# Patient Record
Sex: Female | Born: 1990 | Race: Black or African American | Hispanic: No | Marital: Single | Smoking: Never smoker
Health system: Southern US, Community
[De-identification: ages and names within clinical notes are randomized; demographics above are authoritative.]

## PROBLEM LIST (undated history)

## (undated) ENCOUNTER — Inpatient Hospital Stay (HOSPITAL_COMMUNITY): Payer: Self-pay

## (undated) DIAGNOSIS — F32A Depression, unspecified: Secondary | ICD-10-CM

## (undated) DIAGNOSIS — R519 Headache, unspecified: Secondary | ICD-10-CM

## (undated) DIAGNOSIS — R1310 Dysphagia, unspecified: Secondary | ICD-10-CM

## (undated) DIAGNOSIS — R51 Headache: Secondary | ICD-10-CM

## (undated) DIAGNOSIS — R634 Abnormal weight loss: Secondary | ICD-10-CM

## (undated) DIAGNOSIS — Z22322 Carrier or suspected carrier of Methicillin resistant Staphylococcus aureus: Secondary | ICD-10-CM

## (undated) DIAGNOSIS — R5383 Other fatigue: Secondary | ICD-10-CM

## (undated) DIAGNOSIS — I10 Essential (primary) hypertension: Secondary | ICD-10-CM

## (undated) DIAGNOSIS — F419 Anxiety disorder, unspecified: Secondary | ICD-10-CM

## (undated) DIAGNOSIS — E079 Disorder of thyroid, unspecified: Secondary | ICD-10-CM

## (undated) DIAGNOSIS — H539 Unspecified visual disturbance: Secondary | ICD-10-CM

## (undated) DIAGNOSIS — E0590 Thyrotoxicosis, unspecified without thyrotoxic crisis or storm: Secondary | ICD-10-CM

## (undated) HISTORY — DX: Other fatigue: R53.83

## (undated) HISTORY — DX: Dysphagia, unspecified: R13.10

## (undated) HISTORY — DX: Headache, unspecified: R51.9

## (undated) HISTORY — DX: Disorder of thyroid, unspecified: E07.9

## (undated) HISTORY — DX: Abnormal weight loss: R63.4

## (undated) HISTORY — DX: Essential (primary) hypertension: I10

## (undated) HISTORY — DX: Unspecified visual disturbance: H53.9

## (undated) HISTORY — DX: Headache: R51

---
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
## 2010-01-23 ENCOUNTER — Ambulatory Visit (HOSPITAL_COMMUNITY)
Admission: RE | Admit: 2010-01-23 | Discharge: 2010-01-23 | Payer: Self-pay | Source: Home / Self Care | Attending: Obstetrics | Admitting: Obstetrics

## 2010-02-15 ENCOUNTER — Inpatient Hospital Stay (HOSPITAL_COMMUNITY)
Admission: AD | Admit: 2010-02-15 | Discharge: 2010-02-15 | Payer: Self-pay | Source: Home / Self Care | Attending: Obstetrics | Admitting: Obstetrics

## 2010-02-23 LAB — URINALYSIS, ROUTINE W REFLEX MICROSCOPIC
Bilirubin Urine: NEGATIVE
Hgb urine dipstick: NEGATIVE
Ketones, ur: NEGATIVE mg/dL
Nitrite: NEGATIVE
Protein, ur: NEGATIVE mg/dL
Specific Gravity, Urine: 1.02 (ref 1.005–1.030)
Urine Glucose, Fasting: NEGATIVE mg/dL
Urobilinogen, UA: 0.2 mg/dL (ref 0.0–1.0)
pH: 6 (ref 5.0–8.0)

## 2010-02-23 LAB — COMPREHENSIVE METABOLIC PANEL
ALT: 13 U/L (ref 0–35)
AST: 15 U/L (ref 0–37)
Albumin: 2.8 g/dL — ABNORMAL LOW (ref 3.5–5.2)
Alkaline Phosphatase: 94 U/L (ref 39–117)
BUN: 3 mg/dL — ABNORMAL LOW (ref 6–23)
CO2: 25 mEq/L (ref 19–32)
Calcium: 8.8 mg/dL (ref 8.4–10.5)
Chloride: 108 mEq/L (ref 96–112)
Creatinine, Ser: 0.33 mg/dL — ABNORMAL LOW (ref 0.4–1.2)
GFR calc Af Amer: >60 mL/min (ref >60)
GFR calc non Af Amer: >60 mL/min (ref >60)
Glucose, Bld: 98 mg/dL (ref 70–99)
Potassium: 3.9 mEq/L (ref 3.5–5.1)
Sodium: 137 mEq/L (ref 135–145)
Total Bilirubin: 0.3 mg/dL (ref 0.3–1.2)
Total Protein: 5.8 g/dL — ABNORMAL LOW (ref 6.0–8.3)

## 2010-02-23 LAB — CBC
HCT: 32.3 % — ABNORMAL LOW (ref 36.0–46.0)
Hemoglobin: 11.5 g/dL — ABNORMAL LOW (ref 12.0–15.0)
MCH: 25.6 pg — ABNORMAL LOW (ref 26.0–34.0)
MCHC: 35.6 g/dL (ref 30.0–36.0)
MCV: 71.9 fL — ABNORMAL LOW (ref 78.0–100.0)
Platelets: 262 10*3/uL (ref 150–400)
RBC: 4.49 MIL/uL (ref 3.87–5.11)
RDW: 13.4 % (ref 11.5–15.5)
WBC: 9.2 10*3/uL (ref 4.0–10.5)

## 2010-04-23 LAB — URINALYSIS, ROUTINE W REFLEX MICROSCOPIC
Bilirubin Urine: NEGATIVE
Glucose, UA: NEGATIVE mg/dL
Ketones, ur: NEGATIVE mg/dL
Leukocytes, UA: NEGATIVE
Nitrite: NEGATIVE
Protein, ur: NEGATIVE mg/dL
Specific Gravity, Urine: 1.025 (ref 1.005–1.030)
Urobilinogen, UA: 0.2 mg/dL (ref 0.0–1.0)
pH: 5.5 (ref 5.0–8.0)

## 2010-04-23 LAB — WET PREP, GENITAL
Clue Cells Wet Prep HPF POC: NONE SEEN
Trich, Wet Prep: NONE SEEN
Yeast Wet Prep HPF POC: NONE SEEN

## 2010-04-23 LAB — URINE MICROSCOPIC-ADD ON

## 2010-04-23 LAB — GC/CHLAMYDIA PROBE AMP, GENITAL
Chlamydia, DNA Probe: NEGATIVE
GC Probe Amp, Genital: NEGATIVE

## 2010-04-24 LAB — POCT PREGNANCY, URINE: Preg Test, Ur: POSITIVE

## 2010-05-14 LAB — DIFFERENTIAL
Basophils Absolute: 0 10*3/uL (ref 0.0–0.1)
Basophils Absolute: 0 10*3/uL (ref 0.0–0.1)
Basophils Relative: 0 % (ref 0–1)
Basophils Relative: 0 % (ref 0–1)
Eosinophils Absolute: 0.2 10*3/uL (ref 0.0–0.7)
Eosinophils Absolute: 0.4 10*3/uL (ref 0.0–0.7)
Eosinophils Relative: 1 % (ref 0–5)
Eosinophils Relative: 2 % (ref 0–5)
Lymphocytes Relative: 5 % — ABNORMAL LOW (ref 12–46)
Lymphocytes Relative: 6 % — ABNORMAL LOW (ref 12–46)
Lymphs Abs: 1 10*3/uL (ref 0.7–4.0)
Lymphs Abs: 1.2 10*3/uL (ref 0.7–4.0)
Monocytes Absolute: 0.2 10*3/uL (ref 0.1–1.0)
Monocytes Absolute: 0.6 10*3/uL (ref 0.1–1.0)
Monocytes Relative: 1 % — ABNORMAL LOW (ref 3–12)
Monocytes Relative: 3 % (ref 3–12)
Neutro Abs: 18.2 10*3/uL — ABNORMAL HIGH (ref 1.7–7.7)
Neutro Abs: 19.3 10*3/uL — ABNORMAL HIGH (ref 1.7–7.7)
Neutrophils Relative %: 90 % — ABNORMAL HIGH (ref 43–77)
Neutrophils Relative %: 93 % — ABNORMAL HIGH (ref 43–77)

## 2010-05-14 LAB — CBC
HCT: 32 % — ABNORMAL LOW (ref 36.0–46.0)
HCT: 32.4 % — ABNORMAL LOW (ref 36.0–46.0)
HCT: 33.1 % — ABNORMAL LOW (ref 36.0–46.0)
HCT: 33.2 % — ABNORMAL LOW (ref 36.0–46.0)
HCT: 37.4 % (ref 36.0–46.0)
Hemoglobin: 10.5 g/dL — ABNORMAL LOW (ref 12.0–15.0)
Hemoglobin: 10.6 g/dL — ABNORMAL LOW (ref 12.0–15.0)
Hemoglobin: 10.8 g/dL — ABNORMAL LOW (ref 12.0–15.0)
Hemoglobin: 11.1 g/dL — ABNORMAL LOW (ref 12.0–15.0)
Hemoglobin: 12.3 g/dL (ref 12.0–15.0)
MCHC: 32.5 g/dL (ref 30.0–36.0)
MCHC: 32.6 g/dL (ref 30.0–36.0)
MCHC: 33.2 g/dL (ref 30.0–36.0)
MCHC: 33.4 g/dL (ref 30.0–36.0)
MCHC: 32.8 g/dL (ref 30.0–36.0)
MCV: 86.8 fL (ref 78.0–100.0)
MCV: 87.8 fL (ref 78.0–100.0)
MCV: 88.1 fL (ref 78.0–100.0)
MCV: 89.1 fL (ref 78.0–100.0)
MCV: 88.7 fL (ref 78.0–100.0)
Platelets: 250 10*3/uL (ref 150–400)
Platelets: 451 10*3/uL — ABNORMAL HIGH (ref 150–400)
Platelets: 546 10*3/uL — ABNORMAL HIGH (ref 150–400)
Platelets: 589 10*3/uL — ABNORMAL HIGH (ref 150–400)
Platelets: 304 10*3/uL (ref 150–400)
RBC: 3.69 MIL/uL — ABNORMAL LOW (ref 3.87–5.11)
RBC: 3.69 MIL/uL — ABNORMAL LOW (ref 3.87–5.11)
RBC: 3.71 MIL/uL — ABNORMAL LOW (ref 3.87–5.11)
RBC: 3.77 MIL/uL — ABNORMAL LOW (ref 3.87–5.11)
RBC: 4.22 MIL/uL (ref 3.87–5.11)
RDW: 13.4 % (ref 11.5–15.5)
RDW: 13.4 % (ref 11.5–15.5)
RDW: 13.5 % (ref 11.5–15.5)
RDW: 13.6 % (ref 11.5–15.5)
RDW: 13.5 % (ref 11.5–15.5)
WBC: 19.6 10*3/uL — ABNORMAL HIGH (ref 4.0–10.5)
WBC: 21.5 10*3/uL — ABNORMAL HIGH (ref 4.0–10.5)
WBC: 22.7 10*3/uL — ABNORMAL HIGH (ref 4.0–10.5)
WBC: 26.4 10*3/uL — ABNORMAL HIGH (ref 4.0–10.5)
WBC: 13.1 10*3/uL — ABNORMAL HIGH (ref 4.0–10.5)

## 2010-05-14 LAB — CREATININE, SERUM
Creatinine, Ser: 0.94 mg/dL (ref 0.4–1.2)
GFR calc Af Amer: >60 mL/min (ref >60)
GFR calc non Af Amer: >60 mL/min (ref >60)

## 2010-05-14 LAB — GENTAMICIN LEVEL, TROUGH: Gentamicin Trough: 0.5 ug/mL (ref 0.5–2.0)

## 2010-05-14 LAB — RPR: RPR Ser Ql: NONREACTIVE

## 2010-05-14 LAB — GENTAMICIN LEVEL, PEAK: Gentamicin Pk: 5.1 ug/mL (ref 5.0–10.0)

## 2010-05-21 LAB — WET PREP, GENITAL
Clue Cells Wet Prep HPF POC: NONE SEEN
Trich, Wet Prep: NONE SEEN

## 2010-05-21 LAB — GC/CHLAMYDIA PROBE AMP, GENITAL
Chlamydia, DNA Probe: NEGATIVE
GC Probe Amp, Genital: NEGATIVE

## 2010-05-21 LAB — ABO/RH: ABO/RH(D): O POS

## 2010-05-27 ENCOUNTER — Other Ambulatory Visit (HOSPITAL_COMMUNITY): Payer: Self-pay | Admitting: Obstetrics

## 2010-05-27 DIAGNOSIS — IMO0002 Reserved for concepts with insufficient information to code with codable children: Secondary | ICD-10-CM

## 2010-05-29 ENCOUNTER — Ambulatory Visit (HOSPITAL_COMMUNITY)
Admission: RE | Admit: 2010-05-29 | Discharge: 2010-05-29 | Disposition: A | Discharge status: Home / Self Care | Payer: Managed Care, Other (non HMO) | Source: Ambulatory Visit | Attending: Obstetrics | Admitting: Obstetrics

## 2010-05-29 DIAGNOSIS — IMO0002 Reserved for concepts with insufficient information to code with codable children: Secondary | ICD-10-CM

## 2010-05-29 DIAGNOSIS — O34219 Maternal care for unspecified type scar from previous cesarean delivery: Secondary | ICD-10-CM | POA: Insufficient documentation

## 2010-05-29 DIAGNOSIS — Z3689 Encounter for other specified antenatal screening: Secondary | ICD-10-CM | POA: Insufficient documentation

## 2010-06-03 ENCOUNTER — Inpatient Hospital Stay (HOSPITAL_COMMUNITY): Payer: Managed Care, Other (non HMO)

## 2010-06-03 ENCOUNTER — Inpatient Hospital Stay (HOSPITAL_COMMUNITY)
Admission: AD | Admit: 2010-06-03 | Discharge: 2010-06-03 | Disposition: A | Discharge status: Home / Self Care | Payer: Managed Care, Other (non HMO) | Source: Ambulatory Visit | Attending: Obstetrics | Admitting: Obstetrics

## 2010-06-03 ENCOUNTER — Encounter (HOSPITAL_COMMUNITY): Payer: Self-pay | Admitting: Radiology

## 2010-06-03 DIAGNOSIS — O368190 Decreased fetal movements, unspecified trimester, not applicable or unspecified: Secondary | ICD-10-CM | POA: Insufficient documentation

## 2010-06-05 ENCOUNTER — Inpatient Hospital Stay (HOSPITAL_COMMUNITY): Payer: Managed Care, Other (non HMO)

## 2010-06-05 ENCOUNTER — Inpatient Hospital Stay (HOSPITAL_COMMUNITY)
Admission: AD | Admit: 2010-06-05 | Discharge: 2010-06-05 | Disposition: A | Discharge status: Home / Self Care | Payer: Managed Care, Other (non HMO) | Source: Ambulatory Visit | Attending: Obstetrics | Admitting: Obstetrics

## 2010-06-05 DIAGNOSIS — Z0371 Encounter for suspected problem with amniotic cavity and membrane ruled out: Secondary | ICD-10-CM | POA: Insufficient documentation

## 2010-06-08 ENCOUNTER — Inpatient Hospital Stay (HOSPITAL_COMMUNITY)
Admission: AD | Admit: 2010-06-08 | Discharge: 2010-06-11 | DRG: 775 | Disposition: A | Discharge status: Home / Self Care | Payer: Managed Care, Other (non HMO) | Source: Ambulatory Visit | Attending: Obstetrics | Admitting: Obstetrics

## 2010-06-08 ENCOUNTER — Inpatient Hospital Stay (HOSPITAL_COMMUNITY): Payer: Managed Care, Other (non HMO)

## 2010-06-08 DIAGNOSIS — O4100X0 Oligohydramnios, unspecified trimester, not applicable or unspecified: Principal | ICD-10-CM | POA: Diagnosis present

## 2010-06-08 DIAGNOSIS — O34219 Maternal care for unspecified type scar from previous cesarean delivery: Secondary | ICD-10-CM | POA: Diagnosis present

## 2010-06-08 LAB — CBC
MCH: 25.2 pg — ABNORMAL LOW (ref 26.0–34.0)
MCHC: 33.8 g/dL (ref 30.0–36.0)
Platelets: 270 10*3/uL (ref 150–400)
RBC: 4.85 MIL/uL (ref 3.87–5.11)

## 2010-06-08 LAB — RPR: RPR Ser Ql: NONREACTIVE

## 2010-06-10 LAB — CBC
MCH: 24.5 pg — ABNORMAL LOW (ref 26.0–34.0)
MCHC: 32.6 g/dL (ref 30.0–36.0)
Platelets: 215 10*3/uL (ref 150–400)

## 2010-09-10 ENCOUNTER — Ambulatory Visit (INDEPENDENT_AMBULATORY_CARE_PROVIDER_SITE_OTHER): Payer: Managed Care, Other (non HMO) | Admitting: General Surgery

## 2010-09-22 ENCOUNTER — Ambulatory Visit (INDEPENDENT_AMBULATORY_CARE_PROVIDER_SITE_OTHER): Payer: Managed Care, Other (non HMO) | Admitting: Surgery

## 2010-10-05 ENCOUNTER — Encounter (INDEPENDENT_AMBULATORY_CARE_PROVIDER_SITE_OTHER): Payer: Self-pay | Admitting: General Surgery

## 2010-10-07 ENCOUNTER — Ambulatory Visit (INDEPENDENT_AMBULATORY_CARE_PROVIDER_SITE_OTHER): Payer: Managed Care, Other (non HMO) | Admitting: General Surgery

## 2010-11-02 ENCOUNTER — Encounter (INDEPENDENT_AMBULATORY_CARE_PROVIDER_SITE_OTHER): Payer: Self-pay | Admitting: Surgery

## 2010-11-03 ENCOUNTER — Ambulatory Visit (INDEPENDENT_AMBULATORY_CARE_PROVIDER_SITE_OTHER): Payer: Medicaid Other | Admitting: Surgery

## 2010-11-03 ENCOUNTER — Encounter (INDEPENDENT_AMBULATORY_CARE_PROVIDER_SITE_OTHER): Payer: Self-pay | Admitting: Surgery

## 2010-11-03 VITALS — BP 122/78 | HR 70 | Temp 97.2°F | Resp 20 | Ht 64.0 in | Wt 147.0 lb

## 2010-11-03 DIAGNOSIS — E0500 Thyrotoxicosis with diffuse goiter without thyrotoxic crisis or storm: Secondary | ICD-10-CM | POA: Insufficient documentation

## 2010-11-03 DIAGNOSIS — E049 Nontoxic goiter, unspecified: Secondary | ICD-10-CM | POA: Insufficient documentation

## 2010-11-03 NOTE — Progress Notes (Signed)
Chief Complaint  Patient presents with  . Other    new pt-eval of thyroid     HPI Tanya Chapman is a 20 y.o. female.   HPIThis is a pleasant female referred by Dr. Margaretmary Bayley for evaluation of Graves' disease and thyroid goiter. The patient has been suffering from this for several years. She is interested in thyroidectomy as she has been noncompliant with her medications. She has typical symptoms of hyper thyroidism and has been followed closely by Dr. Chestine Spore for this.  Past Medical History  Diagnosis Date  . Thyroid disease     hyperthyroid   . Weight loss, unintentional   . Trouble swallowing   . Visual disturbance     eyes bulging, blurry, and hurting   . Generalized headaches     migraines and generalized   . Fatigue     Past Surgical History  Procedure Date  . Hernia repair 1996  . Cesarean section 11/22/08    History reviewed. No pertinent family history.  Social History History  Substance Use Topics  . Smoking status: Never Smoker   . Smokeless tobacco: Not on file  . Alcohol Use: No    No Known Allergies  Current Outpatient Prescriptions  Medication Sig Dispense Refill  . methimazole (TAPAZOLE) 5 MG tablet Take 5 mg by mouth 2 (two) times daily. Confirm dosage with patient at next appt. Unsure of it at this time.         Review of Systems Review of Systems An extensive review of system was evaluated with the patient. It is positive for weight loss, trouble swallowing, visual disturbances, headache, and weakness. It is negative from a cardiopulmonary standpoint. Blood pressure 122/78, pulse 70, temperature 97.2 F (36.2 C), resp. rate 20, height 5\' 4"  (1.626 m), weight 147 lb (66.679 kg).  Physical Exam Physical Exam  Constitutional: She is oriented to person, place, and time. She appears well-developed and well-nourished. No distress.  HENT:  Head: Normocephalic and atraumatic.  Right Ear: External ear normal.  Left Ear: External ear normal.    Nose: Nose normal.  Mouth/Throat: Oropharynx is clear and moist.  Eyes: Conjunctivae are normal. Pupils are equal, round, and reactive to light. No scleral icterus.  Neck: Normal range of motion. Neck supple. No tracheal deviation present. Thyromegaly present.  Cardiovascular: Normal rate, regular rhythm, normal heart sounds and intact distal pulses.   No murmur heard. Pulmonary/Chest: Effort normal and breath sounds normal. No stridor. No respiratory distress. She has no wheezes. She has no rales.  Abdominal: Soft. Bowel sounds are normal. She exhibits no distension. There is no tenderness.  Musculoskeletal: Normal range of motion. She exhibits no edema and no tenderness.  Lymphadenopathy:    She has no cervical adenopathy.  Neurological: She is alert and oriented to person, place, and time. She displays normal reflexes. No cranial nerve deficit.  Skin: Skin is warm and dry. No rash noted. No erythema.  Psychiatric: Her behavior is normal. Judgment normal.    Data Reviewed I have the notes from Dr. Ophelia Charter office which I have reviewed as well as her recent laboratory data.  Assessment    Patient with Graves' disease and multinodular goiter which is symptomatic.    Plan    Because of her noncompliance and her symptoms, she is eager to proceed with an his recommended she undergo total thyroidectomy. I discussed this decision with her in detail. Dr. Chestine Spore as discussed medical management with her. Again, she wished to proceed  with thyroidectomy. I discussed the risks of surgery which includes but is not limited to bleeding, infection, injury to the recurrent laryngeal nerve, hoarseness, damage to the parathyroid glands, hypocalcemia, et Karie Soda. She understands and wishes to proceed. Surgery will be scheduled.       Tanya Chapman A 11/03/2010, 10:56 AM

## 2010-11-19 ENCOUNTER — Encounter (HOSPITAL_COMMUNITY)
Admission: RE | Admit: 2010-11-19 | Discharge: 2010-11-19 | Disposition: A | Discharge status: Home / Self Care | Payer: Managed Care, Other (non HMO) | Source: Ambulatory Visit | Attending: Surgery | Admitting: Surgery

## 2010-11-19 LAB — CBC
Platelets: 320 10*3/uL (ref 150–400)
RDW: 13 % (ref 11.5–15.5)
WBC: 5.1 10*3/uL (ref 4.0–10.5)

## 2010-11-19 LAB — BASIC METABOLIC PANEL
GFR calc Af Amer: >90 mL/min (ref >90)
GFR calc non Af Amer: >90 mL/min (ref >90)
Potassium: 3.9 mEq/L (ref 3.5–5.1)
Sodium: 142 mEq/L (ref 135–145)

## 2010-11-19 LAB — SURGICAL PCR SCREEN
MRSA, PCR: POSITIVE — AB
Staphylococcus aureus: POSITIVE — AB

## 2010-11-19 LAB — HCG, SERUM, QUALITATIVE: Preg, Serum: NEGATIVE

## 2010-11-26 ENCOUNTER — Ambulatory Visit (HOSPITAL_COMMUNITY)
Admission: RE | Admit: 2010-11-26 | Discharge: 2010-11-26 | Disposition: A | Discharge status: Home / Self Care | Payer: Managed Care, Other (non HMO) | Source: Ambulatory Visit | Attending: Surgery | Admitting: Surgery

## 2010-11-26 ENCOUNTER — Other Ambulatory Visit (INDEPENDENT_AMBULATORY_CARE_PROVIDER_SITE_OTHER): Payer: Self-pay | Admitting: Surgery

## 2010-11-26 ENCOUNTER — Ambulatory Visit (HOSPITAL_COMMUNITY)
Admission: RE | Admit: 2010-11-26 | Discharge: 2010-11-27 | Disposition: A | Discharge status: Home / Self Care | Payer: Managed Care, Other (non HMO) | Source: Ambulatory Visit | Attending: Surgery | Admitting: Surgery

## 2010-11-26 DIAGNOSIS — E0500 Thyrotoxicosis with diffuse goiter without thyrotoxic crisis or storm: Secondary | ICD-10-CM

## 2010-11-26 DIAGNOSIS — Z01812 Encounter for preprocedural laboratory examination: Secondary | ICD-10-CM | POA: Insufficient documentation

## 2010-11-26 HISTORY — PX: TOTAL THYROIDECTOMY: SHX2547

## 2010-11-26 LAB — CALCIUM: Calcium: 8.7 mg/dL (ref 8.4–10.5)

## 2010-11-27 LAB — CALCIUM: Calcium: 8.6 mg/dL (ref 8.4–10.5)

## 2010-11-30 ENCOUNTER — Encounter (INDEPENDENT_AMBULATORY_CARE_PROVIDER_SITE_OTHER): Payer: Self-pay | Admitting: Surgery

## 2010-12-03 NOTE — Op Note (Signed)
NAMETIMIA, CASSELMAN NO.:  [id number]  MEDICAL RECORD NO.:  [id number]  LOCATION:  [facility]                         FACILITY:  [facility]  PHYSICIAN:  Abigail Miyamoto, M.D. DATE OF BIRTH:  [birth year]  DATE OF PROCEDURE:  11/26/2010 DATE OF DISCHARGE:                              OPERATIVE REPORT   PREOPERATIVE DIAGNOSIS:  Graves disease with thyroid goiter.  POSTOPERATIVE DIAGNOSIS:  Graves disease with thyroid goiter.  PROCEDURE:  Total thyroidectomy.  SURGEON:  Abigail Miyamoto, MD  ASSISTANT:  Anselm Pancoast. Zachery Dakins, MD  ANESTHESIA:  General endotracheal anesthesia.  ESTIMATED BLOOD LOSS:  300 mL.  INDICATIONS:  Tanya Chapman is a 20 year old female with Graves disease and a large thyroid multinodular goiter.  She has been on medical management, but has had issues with compliance of this.  She has requested total thyroidectomy, which was agreed to by her endocrinologist, therefore decision was made to proceed with surgery.  PROCEDURE IN DETAIL:  The patient was brought to the operating room, identified as Tanya Chapman.  She was placed supine on the operative table and general anesthesia was induced.  Her neck was then prepped and draped in usual sterile fashion.  A transverse incision was made across the lower neck and skin fold with a scalpel.  This was taken down through the platysma with electrocautery.  The platysma was then excised and superior and inferior skin flaps were created with the cautery. Retractor was brought on to the field and the skin flaps were retracted. I then opened the midline with the electrocautery.  Several bridging veins had to be suture ligated with 3-0 Vicryl sutures.  The patient had a very large thyroid gland, which appeared quite hypervascular.  I first dissected out the left thyroid lobe.  I dissected the strap muscles off the gland and retracting these laterally.  Inferior veins were identified and clipped and  taken down with Harmonic Scalpel.  I then identified the superior pole vessels and took these down with surgical clips and Harmonic Scalpel as well.  Both inferior and superior parathyroid glands were identified.  I stayed right on the gland. During the dissection, I had to place several sutures in the gland to achieve hemostasis on vessels that were on the gland itself.  The superior thyroid artery was identified and clipped and ligated as well. I was able to identify the recurrent laryngeal nerve on the left to keep it separate from the specimen.  Several small bridging veins in this area had to be clipped as well.  I was then able to dissect the thyroid gland up off the ligament of Treitz attachments on the trachea.  I then placed a piece of gauze in the fossa thyroid glands for hemostasis.  I then took the gland across on top of the trachea and took down the pyramidal lobe as well.  I then separated the strap muscle over top of the right lobe and turned my attention towards the right lobe of the gland.  Again I stayed right on top of the gland.  I again had to place several sutures on the gland itself secondary to hypervascularity.  I first took down the superior pole.  I again identified  the vessels and controlled them with surgical clips and taken down with Harmonic Scalpel.  I then identified the lower pole vessels and did the same. Again another superior and inferior parathyroid gland was identified on this side as well as the recurrent laryngeal nerve.  Dissection on this side was slightly easier in the area of the recurrent laryngeal nerve. I was able to dissect the gland off the superior thyroid vessel and ligate as well with surgical clips.  I had a complete thyroidectomy taking the rest of the thyroid off the trachea.  I then placed a suture in the right superior pole of the gland.  I then irrigated both sides of the neck with saline, I achieved hemostasis with surgical clips  as needed.  I then placed a piece of fibular in each area of the incision. Hemostasis appeared to be achieved.  I then closed the midline with interrupted 3-0 Vicryl sutures.  I then reapproximated the platysma with 3-0 Vicryl sutures and closed the skin with running 4-0 Monocryl.  Steri- Strips, gauze, and tape were then applied.  The patient tolerated the procedure well.  All counts were correct at the end of procedure.  The patient was then extubated in the operating room and taken in a stable condition to recovery room.     Abigail Miyamoto, M.D.     DB/MEDQ  D:  11/26/2010  T:  11/26/2010  Job:  [id number]  Electronically Signed by Abigail Miyamoto M.D. on 12/03/2010 09:07:46 AM

## 2010-12-07 ENCOUNTER — Encounter (INDEPENDENT_AMBULATORY_CARE_PROVIDER_SITE_OTHER): Payer: Self-pay | Admitting: Surgery

## 2010-12-07 ENCOUNTER — Ambulatory Visit (INDEPENDENT_AMBULATORY_CARE_PROVIDER_SITE_OTHER): Payer: Managed Care, Other (non HMO) | Admitting: Surgery

## 2010-12-07 VITALS — BP 118/72 | HR 78 | Temp 96.9°F | Resp 14 | Ht 64.0 in | Wt 148.4 lb

## 2010-12-07 DIAGNOSIS — Z09 Encounter for follow-up examination after completed treatment for conditions other than malignant neoplasm: Secondary | ICD-10-CM

## 2010-12-07 NOTE — Progress Notes (Signed)
Subjective:     Patient ID: Tanya Chapman, female   DOB: 12-Oct-1990, 20 y.o.   MRN: [id number]  HPI  She is here for a postoperative visit. She does have some mild neck discomfort and hoarseness. She has had no perioral or oral tingling or cramping. Review of Systems     Objective:   Physical Exam On exam, her incision is healing well. There is no evidence of infection. The final pathology showed chronic inflammation and quarters changes. There was no evidence of malignancy.    Assessment:     Patient status post total thyroidectomy for Graves' disease    Plan:     She can make an appointment with her endocrinologist. She continue Synthroid. I reviewed her Percocet. I will see her back in one month

## 2011-01-05 ENCOUNTER — Ambulatory Visit (INDEPENDENT_AMBULATORY_CARE_PROVIDER_SITE_OTHER): Payer: Managed Care, Other (non HMO) | Admitting: Surgery

## 2011-01-05 ENCOUNTER — Encounter (INDEPENDENT_AMBULATORY_CARE_PROVIDER_SITE_OTHER): Payer: Self-pay | Admitting: Surgery

## 2011-01-05 VITALS — BP 104/68 | HR 68 | Temp 98.6°F | Resp 14 | Ht 64.0 in | Wt 151.6 lb

## 2011-01-05 DIAGNOSIS — Z09 Encounter for follow-up examination after completed treatment for conditions other than malignant neoplasm: Secondary | ICD-10-CM

## 2011-01-05 NOTE — Progress Notes (Signed)
Subjective:     Patient ID: Tanya Chapman, female   DOB: August 30, 1990, 20 y.o.   MRN: [id number]  HPI  She is here for another postoperative visit. She has no complaints. She missed her appointment with her endocrinologist. She does continue to take her Synthroid Review of Systems     Objective:   Physical Exam    On exam, her voice is fairly normal. Her incision is healing very well. Assessment:     Patient status post total thyroidectomy for Graves' disease    Plan:     I have encouraged her to see her endocrinologist to adjust her Synthroid. I will see her here as needed.

## 2011-01-19 ENCOUNTER — Encounter (INDEPENDENT_AMBULATORY_CARE_PROVIDER_SITE_OTHER): Payer: Self-pay | Admitting: Surgery

## 2011-03-27 ENCOUNTER — Encounter (HOSPITAL_COMMUNITY): Payer: Self-pay | Admitting: *Deleted

## 2011-03-27 ENCOUNTER — Inpatient Hospital Stay (HOSPITAL_COMMUNITY)
Admission: AD | Admit: 2011-03-27 | Discharge: 2011-03-27 | Disposition: A | Discharge status: Home / Self Care | Payer: Managed Care, Other (non HMO) | Source: Ambulatory Visit | Attending: Obstetrics | Admitting: Obstetrics

## 2011-03-27 DIAGNOSIS — N949 Unspecified condition associated with female genital organs and menstrual cycle: Secondary | ICD-10-CM | POA: Insufficient documentation

## 2011-03-27 DIAGNOSIS — R109 Unspecified abdominal pain: Secondary | ICD-10-CM | POA: Insufficient documentation

## 2011-03-27 DIAGNOSIS — N938 Other specified abnormal uterine and vaginal bleeding: Secondary | ICD-10-CM | POA: Insufficient documentation

## 2011-03-27 HISTORY — DX: Thyrotoxicosis, unspecified without thyrotoxic crisis or storm: E05.90

## 2011-03-27 LAB — URINALYSIS, ROUTINE W REFLEX MICROSCOPIC
Bilirubin Urine: NEGATIVE
Specific Gravity, Urine: 1.015 (ref 1.005–1.030)
Urobilinogen, UA: 0.2 mg/dL (ref 0.0–1.0)

## 2011-03-27 LAB — CBC
HCT: 34.3 % — ABNORMAL LOW (ref 36.0–46.0)
MCHC: 33.2 g/dL (ref 30.0–36.0)
Platelets: 302 10*3/uL (ref 150–400)
RDW: 16.2 % — ABNORMAL HIGH (ref 11.5–15.5)
WBC: 4 10*3/uL (ref 4.0–10.5)

## 2011-03-27 LAB — URINE MICROSCOPIC-ADD ON

## 2011-03-27 LAB — WET PREP, GENITAL
Trich, Wet Prep: NONE SEEN
Yeast Wet Prep HPF POC: NONE SEEN

## 2011-03-27 LAB — POCT PREGNANCY, URINE: Preg Test, Ur: NEGATIVE

## 2011-03-27 MED ORDER — LIDOCAINE HCL (PF) 1 % IJ SOLN
INTRAMUSCULAR | Status: AC
  Filled 2011-03-27: qty 30

## 2011-03-27 MED ORDER — IBUPROFEN 600 MG PO TABS: 600.0000 mg | ORAL_TABLET | Freq: Four times a day (QID) | ORAL | Status: AC | PRN

## 2011-03-27 MED ORDER — KETOROLAC TROMETHAMINE 60 MG/2ML IM SOLN
60.0000 mg | Freq: Once | INTRAMUSCULAR | Status: AC
  Administered 2011-03-27: 60 mg via INTRAMUSCULAR
  Filled 2011-03-27: qty 2

## 2011-03-27 NOTE — Progress Notes (Signed)
SW received request from unit RN, Victorino Dike, to assess pt for increased crying and feeling overwhelmed.  Pt said she had her thyroid removed in October 2012 and has not been compliant with MD appointments.  Pt has two young children at home and works second shift.  She lives with her boyfriend and his mother.  She stated the boyfriend copes by using alcohol and "says things to try and make me feel bad".  She denied physical abuse.  SW offered information for counseling centers, but she declined.  Encouraged her to reach out to her support network for assistance as needed.  Pt disclosed that she has issues with trust and takes care of others and not always herself.  Pt stated she has appointment with Dr. Chestine Spore on Tuesday.

## 2011-03-27 NOTE — ED Provider Notes (Signed)
History     Chief Complaint  Patient presents with  . Abdominal Pain  . Vaginal Bleeding   HPI Tanya Chapman 21 y.o. Comes to MAU with lower abdominal pain and periodic bleeding for one month.  Has been on Depo for 8 months and Depo is overdue.  Has an appointment on Tuesday to get Depo.  Also has history of thyroid problems and had thyroid removed in October.  Has been on Synthroid.  States she is overwhelmed with her children, work, the bleeding and her other medical problems.  Is tearful.  OB History    Grav Para Term Preterm Abortions TAB SAB Ect Mult Living   3 1 1  0 1 1 0 0 0 2      Past Medical History  Diagnosis Date  . Thyroid disease     hyperthyroid   . Weight loss, unintentional   . Trouble swallowing   . Visual disturbance     eyes bulging, blurry, and hurting   . Generalized headaches     migraines and generalized   . Fatigue   . Hyperthyroidism     Past Surgical History  Procedure Date  . Hernia repair 1996  . Cesarean section 11/22/08  . Total thyroidectomy 11/26/10    History reviewed. No pertinent family history.  History  Substance Use Topics  . Smoking status: Never Smoker   . Smokeless tobacco: Never Used  . Alcohol Use: No    Allergies: No Known Allergies  Prescriptions prior to admission  Medication Sig Dispense Refill  . ibuprofen (ADVIL,MOTRIN) 200 MG tablet Take 200 mg by mouth once.      Marland Kitchen levothyroxine (SYNTHROID, LEVOTHROID) 100 MCG tablet Take 100 mcg by mouth daily.       Marland Kitchen OVER THE COUNTER MEDICATION Take 1 tablet by mouth daily. Pt states that she takes a calcium tablet        Review of Systems  Gastrointestinal: Positive for abdominal pain. Negative for nausea and vomiting.  Genitourinary: Negative for dysuria.       Vaginal bleeding  Psychiatric/Behavioral:       Tearful   Physical Exam   Blood pressure 132/94, pulse 89, temperature 97.8 F (36.6 C), temperature source Oral, resp. rate 18, height 5\' 4"  (1.626  m), weight 155 lb 12.8 oz (70.67 kg), unknown if currently breastfeeding.  Physical Exam  Nursing note and vitals reviewed. Constitutional: She is oriented to person, place, and time. She appears well-developed and well-nourished.  HENT:  Head: Normocephalic.  Eyes: EOM are normal.  Neck: Neck supple.  GI: Soft. There is no tenderness. There is no rebound and no guarding.  Genitourinary:       Speculum exam: Vagina - Small amount of creamy discharge, no odor Cervix - No contact bleeding Bimanual exam: Cervix closed Uterus non tender, normal size Adnexa non tender, no masses bilaterally GC/Chlam, wet prep done Chaperone present for exam.  Musculoskeletal: Normal range of motion.  Neurological: She is alert and oriented to person, place, and time.  Skin: Skin is warm and dry.  Psychiatric: She has a normal mood and affect.       Crying when talking about medical problems.    MAU Course  Procedures  MDM Social worker came to talk with client due to her feeling overwhelmed by her social issues at home. Results for orders placed during the hospital encounter of 03/27/11 (from the past 24 hour(s))  URINALYSIS, ROUTINE W REFLEX MICROSCOPIC  Status: Abnormal   Collection Time   03/27/11  9:15 AM      Component Value Range   Color, Urine YELLOW  YELLOW    APPearance CLEAR  CLEAR    Specific Gravity, Urine 1.015  1.005 - 1.030    pH 7.0  5.0 - 8.0    Glucose, UA NEGATIVE  NEGATIVE (mg/dL)   Hgb urine dipstick MODERATE (*) NEGATIVE    Bilirubin Urine NEGATIVE  NEGATIVE    Ketones, ur NEGATIVE  NEGATIVE (mg/dL)   Protein, ur 30 (*) NEGATIVE (mg/dL)   Urobilinogen, UA 0.2  0.0 - 1.0 (mg/dL)   Nitrite NEGATIVE  NEGATIVE    Leukocytes, UA NEGATIVE  NEGATIVE   URINE MICROSCOPIC-ADD ON     Status: Abnormal   Collection Time   03/27/11  9:15 AM      Component Value Range   Squamous Epithelial / LPF FEW (*) RARE    WBC, UA 0-2  <3 (WBC/hpf)   RBC / HPF 3-6  <3 (RBC/hpf)    Bacteria, UA FEW (*) RARE   POCT PREGNANCY, URINE     Status: Normal   Collection Time   03/27/11  9:32 AM      Component Value Range   Preg Test, Ur NEGATIVE  NEGATIVE   WET PREP, GENITAL     Status: Abnormal   Collection Time   03/27/11 10:05 AM      Component Value Range   Yeast Wet Prep HPF POC NONE SEEN  NONE SEEN    Trich, Wet Prep NONE SEEN  NONE SEEN    Clue Cells Wet Prep HPF POC NONE SEEN  NONE SEEN    WBC, Wet Prep HPF POC FEW (*) NONE SEEN   CBC     Status: Abnormal   Collection Time   03/27/11 10:26 AM      Component Value Range   WBC 4.0  4.0 - 10.5 (K/uL)   RBC 4.16  3.87 - 5.11 (MIL/uL)   Hemoglobin 11.4 (*) 12.0 - 15.0 (g/dL)   HCT 16.1 (*) 09.6 - 46.0 (%)   MCV 82.5  78.0 - 100.0 (fL)   MCH 27.4  26.0 - 34.0 (pg)   MCHC 33.2  30.0 - 36.0 (g/dL)   RDW 04.5 (*) 40.9 - 15.5 (%)   Platelets 302  150 - 400 (K/uL)   Toradol 60 mg IM for pain  Assessment and Plan  Intermittent vaginal bleeding - none today Abdominal pain  Plan Keep your appointment on Tuesday to see Dr. Gaynell Face and get your Depo See Dr. Chestine Spore about your thyroid if you are having concerns. Would advise evaluation for antidepressant medication which may help you feel better. Cultures pending. rx ibuprofen 600 mg po q 6h as needed for pain.  Take with food.  Kaitlin Alcindor 03/27/2011, 10:55 AM   Nolene Bernheim, NP 03/27/11 1307

## 2011-03-27 NOTE — Progress Notes (Addendum)
Pt reports having sharp pains and heavy vaginal bleeding in stomach x 2 days. Repots having frequent periods after having thyroid removed in Octobe.r says she has been having vaginal bleeding most of the month had several days it would stop and then it would be real heavy and start again.. Reports loss of appetite .

## 2011-03-27 NOTE — Discharge Instructions (Signed)
Keep your appointment to see Dr. Gaynell Face If you are having problems with your thyroid, you need to see Dr. Chestine Spore. No infection is identified today. Get your prescription filled and take as directed if you continue to have abdominal pain.

## 2011-03-27 NOTE — ED Notes (Signed)
Pt is very tearful. Says she feels overwhelmed due to her recent diagnosis of hyperthyroidism and recent surgery. Pt says she does not have an intent to harm herself, her children or her boyfriend. Since her thyroid surgery in OCT she has been tearful and feels "overwhelmed". I spoke with Larita Fife from social work and she voices concern for the patient and would like to speak to her while the patient is here in MAU.

## 2011-03-29 LAB — GC/CHLAMYDIA PROBE AMP, GENITAL: Chlamydia, DNA Probe: NEGATIVE

## 2011-07-06 ENCOUNTER — Inpatient Hospital Stay (HOSPITAL_COMMUNITY)
Admission: AD | Admit: 2011-07-06 | Discharge: 2011-07-06 | Disposition: A | Discharge status: Home / Self Care | Payer: Managed Care, Other (non HMO) | Source: Ambulatory Visit | Attending: Obstetrics | Admitting: Obstetrics

## 2011-07-06 ENCOUNTER — Encounter (HOSPITAL_COMMUNITY): Payer: Self-pay | Admitting: *Deleted

## 2011-07-06 ENCOUNTER — Inpatient Hospital Stay (HOSPITAL_COMMUNITY): Payer: Managed Care, Other (non HMO)

## 2011-07-06 DIAGNOSIS — R109 Unspecified abdominal pain: Secondary | ICD-10-CM | POA: Insufficient documentation

## 2011-07-06 DIAGNOSIS — O2390 Unspecified genitourinary tract infection in pregnancy, unspecified trimester: Secondary | ICD-10-CM | POA: Insufficient documentation

## 2011-07-06 DIAGNOSIS — B373 Candidiasis of vulva and vagina: Secondary | ICD-10-CM | POA: Insufficient documentation

## 2011-07-06 DIAGNOSIS — E079 Disorder of thyroid, unspecified: Secondary | ICD-10-CM

## 2011-07-06 DIAGNOSIS — B3731 Acute candidiasis of vulva and vagina: Secondary | ICD-10-CM | POA: Insufficient documentation

## 2011-07-06 DIAGNOSIS — R42 Dizziness and giddiness: Secondary | ICD-10-CM | POA: Insufficient documentation

## 2011-07-06 LAB — WET PREP, GENITAL

## 2011-07-06 LAB — URINALYSIS, ROUTINE W REFLEX MICROSCOPIC
Bilirubin Urine: NEGATIVE
Hgb urine dipstick: NEGATIVE
Nitrite: NEGATIVE
Protein, ur: NEGATIVE mg/dL
Specific Gravity, Urine: 1.01 (ref 1.005–1.030)
Urobilinogen, UA: 0.2 mg/dL (ref 0.0–1.0)

## 2011-07-06 LAB — POCT PREGNANCY, URINE: Preg Test, Ur: POSITIVE — AB

## 2011-07-06 MED ORDER — CONCEPT OB 130-92.4-1 MG PO CAPS: 1.0000 | ORAL_CAPSULE | ORAL | Status: DC

## 2011-07-06 NOTE — MAU Note (Signed)
D. Poe, CNM at bedside.  Assessment done and poc discussed with pt.  

## 2011-07-06 NOTE — MAU Note (Signed)
Went to the drug store- started using monistat yesterday.  Was having itching and burning.

## 2011-07-06 NOTE — MAU Note (Signed)
abd pain for 3-4 days.  Had been seen previously for same, went away, now back.  Hx of "thyroids", didn't know if that was acting up, thought might be pregnant, has been real light headed and dizzy.

## 2011-07-06 NOTE — MAU Note (Signed)
Bedside US done per CNM.  

## 2011-07-06 NOTE — MAU Provider Note (Signed)
Tanya Chapman [id number] @[redacted]w[redacted]d  by LMP Chief Complaint  Patient presents with  . Abdominal Pain  . Dizziness  . Possible Pregnancy     First Provider Initiated Contact with Patient 07/06/11 1915      SUBJECTIVE  HPI: Tanya Chapman if pregnant but thinks so due to pos HPT and feeling tired and dizzy. Also having intermittent sharp brief pain in supruprapubic region, left and right groin with movement and walking. No cramping pain or vaginal bleeding. Yesterday started Monistat for vulvo-vaginal itching and increased white discharge. Irregular menses. Seen last week for thyroid monitoring and Synthroid dose was increased.  Past Medical History  Diagnosis Date  . Thyroid disease     hyperthyroid   . Weight loss, unintentional   . Trouble swallowing   . Visual disturbance     eyes bulging, blurry, and hurting   . Generalized headaches     migraines and generalized   . Fatigue   . Hyperthyroidism    Past Surgical History  Procedure Date  . Hernia repair 1996  . Cesarean section 11/22/08  . Total thyroidectomy 11/26/10   History   Social History  . Marital Status: Single    Spouse Name: N/A    Number of Children: N/A  . Years of Education: N/A   Occupational History  . Not on file.   Social History Main Topics  . Smoking status: Never Smoker   . Smokeless tobacco: Never Used  . Alcohol Use: No  . Drug Use: No  . Sexually Active: Yes    Birth Control/ Protection: None     Relapsed on Depo shot. It was due 2 weeks and patient did not get it.    Other Topics Concern  . Not on file   Social History Narrative  . No narrative on file   No current facility-administered medications on file prior to encounter.   Current Outpatient Prescriptions on File Prior to Encounter  Medication Sig Dispense Refill  . levothyroxine (SYNTHROID, LEVOTHROID) 100 MCG tablet Take 100 mcg by mouth daily.       Marland Kitchen OVER THE COUNTER MEDICATION Take 1 tablet by mouth daily. Pt states  that she takes a calcium tablet       No Known Allergies  ROS: Pertinent items in HPI  OBJECTIVE Blood pressure 102/77, pulse 83, temperature 99.1 F (37.3 C), temperature source Oral, resp. rate 20, height 5\' 5"  (1.651 m), weight 66.679 kg (147 lb), last menstrual period 04/14/2011, SpO2 100.00%.  GENERAL: Well-developed, well-nourished female in no acute distress.  HEENT: Normocephalic, good dentition HEART: normal rate RESP: normal effort ABDOMEN: Soft, nontender EXTREMITIES: Nontender, no edema NEURO: Alert and oriented SPECULUM EXAM: NEFG, curdy white discharge, no blood noted, cervix clean BIMANUAL: cervix L/C; uterus 6-8 wk size; no adnexal tenderness or masses   LAB RESULTS  Results for orders placed during the hospital encounter of 07/06/11 (from the past 24 hour(s))  URINALYSIS, ROUTINE W REFLEX MICROSCOPIC     Status: Normal   Collection Time   07/06/11  6:00 PM      Component Value Range   Color, Urine YELLOW  YELLOW    APPearance CLEAR  CLEAR    Specific Gravity, Urine 1.010  1.005 - 1.030    pH 7.0  5.0 - 8.0    Glucose, UA NEGATIVE  NEGATIVE (mg/dL)   Hgb urine dipstick NEGATIVE  NEGATIVE    Bilirubin Urine NEGATIVE  NEGATIVE    Ketones, ur NEGATIVE  NEGATIVE (mg/dL)  Protein, ur NEGATIVE  NEGATIVE (mg/dL)   Urobilinogen, UA 0.2  0.0 - 1.0 (mg/dL)   Nitrite NEGATIVE  NEGATIVE    Leukocytes, UA NEGATIVE  NEGATIVE   POCT PREGNANCY, URINE     Status: Abnormal   Collection Time   07/06/11  6:06 PM      Component Value Range   Preg Test, Ur POSITIVE (*) NEGATIVE   WET PREP, GENITAL     Status: Abnormal   Collection Time   07/06/11  7:25 PM      Component Value Range   Yeast Wet Prep HPF POC NONE SEEN  NONE SEEN    Trich, Wet Prep NONE SEEN  NONE SEEN    Clue Cells Wet Prep HPF POC NONE SEEN  NONE SEEN    WBC, Wet Prep HPF POC MODERATE (*) NONE SEEN     IMAGING  Bedside US by me: unable to see cardiac activity. Formal US: [redacted]w[redacted]d viable IUP with HR  122  ASSESSMENT Viable early IUP Undesired pregnancy Thyroid disease Possibe yeast vaginitis, on Monistat   PLAN  Pt wants to termninate the pregnancy. Discussed and will give referral information. Rx PNV. Continue Monistat if symptomatic. Cont ynthroid as directed F/U with Dr. Gaynell Face or with [facility]   [name] 07/06/2011 7:32 PM

## 2011-07-06 NOTE — Discharge Instructions (Signed)
Hypothyroidism and Pregnancy Hypothyroidism is a common condition seen in women more than men. It means you have an under-active thyroid gland. The thyroid gland is a hormone gland. It is located in your neck in front of your windpipe. This gland is controlled by the pituitary gland in your brain. The pituitary gland produces thyroid stimulating hormone (TSH). TSH controls the amount of thyroid hormone (TH) produced by the thyroid gland. With hypothyroidism, the gland does not produce enough TH. The body needs this hormone for metabolism. Metabolism is how your body works and Higher education careers adviser.  A baby (fetus) needs to get thyroid hormone from the mother. It is needed for normal growth and brain development. Babies who are born to mothers with hypothyroidism during pregnancy may have lowered IQ scores. They may also have low birth weight or be born prematurely. Their body movement may develop poorly, too. Common problems during pregnancy are fatigue and weight gain. These symptoms may be hidden by the pregnancy. Hypothyroidism can develop before or during pregnancy.  CAUSES   Thyroid gland abnormality.   The pituitary gland in the brain produces too much TSH.   The most common cause before, during and after pregnancy is chronic (lasting) thyroiditis. It is an autoimmune condition that affects the thyroid cells. This is called Hashimoto's disease.   Surgical removal of the thyroid (thyroidectomy).   Radioactive iodine treatment of the thyroid gland that can destroy the gland. This is not used during pregnancy since it can affect the baby.   Lack of iodine in your diet. Most salt products contains iodine.   Women with Type 1 diabetes have a 5 to 8% chance of developing hypothyroid disease while pregnant. Type 1 diabetic women have a 25% chance of developing the disease after they have their baby.  SYMPTOMS  Symptoms of hypothyroidism can develop slowly. They can go undetected if the symptoms are mild.  This can be prevented with early detection in the pregnant mother. When you are pregnant, a mild form of hypothyroid disease can develop into a full blown disease because of an increase in the metabolism (destruction) of thyroid hormone in your body during pregnancy. If you are considering pregnancy, and you or an immediate family member have had problems thyroid problems, tell your caregiver. Make sure that you are watched closely as your caregiver suggests. Some problems you may have before or during pregnancy are:  Constipation.   Fatigue.   Intolerance to cold.   Mental weariness.  More advanced problems with hypothyroidism may include:   Hoarseness.   Swelling of the lower legs.   Dry and thickened skin.   Slowness of thinking.   Decreased sex drive (libido).   Slowed speech.   Weight gain.   Muscle cramps.   Insomnia.   Slow reflexes.   Changes in your voice (deeper).   Puffy face and feet.   Thin, coarse hair.   Thinning of eyebrows.   Decreased appetite.   Increase incidence of carpal tunnel syndrome.   Coma.  Signs of Hypothyroidism include:  Enlarged thyroid gland (goiter).   Small round growths (nodules) in the thyroid gland.   Increase in thyroid stimulating hormone. (A blood test is needed.)   Decrease in thyroid hormone. (A blood test is needed.)  Common problems before pregnancy can include:  Inability to get pregnant.   Changes in menstrual periods.   No menstrual period (amenorrhea).   Miscarriage.  Common thyroid problems during or after pregnancy can include:  The development  of high blood pressure and the chance of a premature delivery are more common.   Babies born to women with untreated hypothyroidism may not have good development of the brain.   Preterm delivery.   Low birth weight babies.   Preeclampsia.   Placental abruption.   Cretinism in baby (mental retardation, failure to grow, nerve (neurologic) and  psychological problems).   Stillbirth.  DIAGNOSIS  Diagnosis is based upon the signs and symptoms of the patient. It is confirmed by blood tests ( increased TSH and decreased TH), ultrasound, and radioactive iodine uptake tests. The radioactive iodine uptake test is not done when you are pregnant. When hypothyroidism is diagnosed early, it can be treated. There should be no problems for you or your baby. Goiter or thyroid nodules found in a pregnant woman should be tested to be sure there is no cancer present. Anyone with a history or family history of thyroid disease should be tested for thyroid disease. TREATMENT  When hypothyroidism is diagnosed early, it can be treated. Treatment during pregnancy should not cause any harm to your baby.  Your caregiver will watch your thyroid hormones (TSH and TH) closely during the pregnancy.   Increase or decrease the dose of thyroid medication as your caregiver advises. This medicine is safe for you and your baby.   Avoid medications or supplements that can block the absorption of the hormone you are taking. For instance, calcium and iron are medications that may decrease the benefits of your thyroid medicine. Taking medications several hours apart will help this.   THS and TH should be checked each month of the pregnancy. At times, they should be checked more often in the third trimester.   All the states, including [place], [place], offer screening tests for hypothyroidism in newborn babies.   If this disease is treated in the first few weeks after the baby is born, it can prevent abnormal growth and mental retardation. The pediatrician should be informed if the mother had hypothyroidism.  HOME CARE INSTRUCTIONS   Avoid medications or supplements, such as calcium and iron, that can block the absorption of the hormone you are taking. If you must take these medications, take them several hours apart.   Pregnant women should take multivitamins that contain  iodine. Your caregiver may suggest that you take of iodine.   Women planning to get pregnant should take a multivitamin containing iodine. Your caregiver may suggest that you take of iodine.   Include foods in your diet that contain iodine, such as iodized salt, spinach and shell fish.   Follow the advice of your caregiver regarding your medication and getting the necessary blood tests.   Get tested for thyroid disease before getting pregnant if you or someone in your family has had thyroid problems.   Get tested if you think you have symptoms of thyroid disease.  SEEK IMMEDIATE MEDICAL CARE IF:   You have decreased or no movements of the baby.   You develop muscle cramps.   You have belly (abdominal) pain.   You gain too much weight in one week (5 pounds or more).   You develop a severe headache or vision problems.   You develop severe swelling of your legs and ankles.   You develop a lump in your neck.   You think you have symptoms of thyroid disease.  MAKE SURE YOU:   Understand these instructions.   Will watch your condition.   Will get help right away if  you are not doing well or get worse.  Document Released: 11/22/2006 Document Revised: 01/14/2011 Document Reviewed: 07/22/2008 [facility] Patient Information 2012 [place], [place].  [facility]: Dr. Arlyce Dice and Dr. Tenny Craw [phone]

## 2011-07-06 NOTE — Progress Notes (Signed)
SSE per CNM.  Wet prep and cultures obtained.  VE done.

## 2011-07-07 LAB — GC/CHLAMYDIA PROBE AMP, GENITAL: GC Probe Amp, Genital: NEGATIVE

## 2011-07-07 NOTE — MAU Provider Note (Signed)
Agree with above note.  Tanya Chapman 07/07/2011 7:35 AM

## 2011-10-21 ENCOUNTER — Telehealth: Payer: Self-pay | Admitting: Gynecology

## 2011-10-21 ENCOUNTER — Encounter: Payer: Self-pay | Admitting: Women's Health

## 2011-10-21 ENCOUNTER — Ambulatory Visit (INDEPENDENT_AMBULATORY_CARE_PROVIDER_SITE_OTHER): Payer: Managed Care, Other (non HMO) | Admitting: Women's Health

## 2011-10-21 VITALS — BP 112/70 | Ht 65.0 in | Wt 150.0 lb

## 2011-10-21 DIAGNOSIS — Z01419 Encounter for gynecological examination (general) (routine) without abnormal findings: Secondary | ICD-10-CM

## 2011-10-21 DIAGNOSIS — Z23 Encounter for immunization: Secondary | ICD-10-CM

## 2011-10-21 LAB — CBC WITH DIFFERENTIAL/PLATELET
Hemoglobin: 10.9 g/dL — ABNORMAL LOW (ref 12.0–15.0)
Lymphocytes Relative: 47 % — ABNORMAL HIGH (ref 12–46)
Lymphs Abs: 1.9 10*3/uL (ref 0.7–4.0)
Monocytes Relative: 6 % (ref 3–12)
Neutrophils Relative %: 43 % (ref 43–77)
Platelets: 364 10*3/uL (ref 150–400)
RBC: 4.17 MIL/uL (ref 3.87–5.11)
WBC: 4.1 10*3/uL (ref 4.0–10.5)

## 2011-10-21 NOTE — Telephone Encounter (Signed)
Pt advised that per Raiford Noble @ [employer] the Mirena IUD and insertion are covered at 100%,no copay. Conf#[id number].Appt made.WL

## 2011-10-21 NOTE — Patient Instructions (Addendum)
Return to office near thanksgiving and then Valentines DayHealth Maintenance, 60- to 21-Year-Old SCHOOL PERFORMANCE After high school completion, the Tanya Chapman adult may be attending college, Scientist, product/process development or vocational school, or entering the [employer] or the work force. SOCIAL AND EMOTIONAL DEVELOPMENT The Tanya Chapman adult establishes adult relationships and explores sexual identity. Tanya Chapman adults may be living at home or in a college dorm or apartment. Increasing independence is important with Tanya Chapman adults. Throughout adolescence, teens should assume responsibility of their own health care. IMMUNIZATIONS Most Tanya Chapman adults should be fully vaccinated. A booster dose of Tdap (tetanus, diphtheria, and pertussis, or "whooping cough"), a dose of meningococcal vaccine to protect against a certain type of bacterial meningitis, hepatitis A, human papillomarvirus (HPV), chickenpox, or measles vaccines may be indicated, if not given at an earlier age. Annual influenza or "flu" vaccination should be considered during flu season.  TESTING Annual screening for vision and hearing problems is recommended. Vision should be screened objectively at least once between 63 and 57 years of age. The Tanya Chapman adult may be screened for anemia or tuberculosis. Tanya Chapman adults should have a blood test to check for high cholesterol during this time period. Tanya Chapman adults should be screened for use of alcohol and drugs. If the Tanya Chapman adult is sexually active, screening for sexually transmitted infections, pregnancy, or HIV may be performed. Screening for cervical cancer should be performed within 3 years of beginning sexual activity. NUTRITION AND ORAL HEALTH  Adequate calcium intake is important. Consume 3 servings of low-fat milk and dairy products daily. For those who do not drink milk or consume dairy products, calcium enriched foods, such as juice, bread, or cereal, dark, leafy greens, or canned fish are alternate sources of calcium.   Drink plenty  of water. Limit fruit juice to 8 to 12 ounces per day. Avoid sugary beverages or sodas.   Discourage skipping meals, especially breakfast. Teens should eat a good variety of vegetables and fruits, as well as lean meats.   Avoid high fat, high salt, and high sugar foods, such as candy, chips, and cookies.   Encourage Tanya Chapman adults to participate in meal planning and preparation.   Eat meals together as a family whenever possible. Encourage conversation at mealtime.   Limit fast food choices and eating out at restaurants.   Brush teeth twice a day and floss.   Schedule dental exams twice a year.  SLEEP Regular sleep habits are important. PHYSICAL, SOCIAL, AND EMOTIONAL DEVELOPMENT  One hour of regular physical activity daily is recommended. Continue to participate in sports.   Encourage Tanya Chapman adults to develop their own interests and consider community service or volunteerism.   Provide guidance to the Tanya Chapman adult in making decisions about college and work plans.   Make sure that Tanya Chapman adults know that they should never be in a situation that makes them uncomfortable, and they should tell partners if they do not want to engage in sexual activity.   Talk to the Tanya Chapman adult about body image. Eating disorders may be noted at this time. Tanya Chapman adults may also be concerned about being overweight. Monitor the Tanya Chapman adult for weight gain or loss.   Mood disturbances, depression, anxiety, alcoholism, or attention problems may be noted in Tanya Chapman adults. Talk to the caregiver if there are concerns about mental illness.   Negotiate limit setting and independent decision making.   Encourage the Tanya Chapman adult to handle conflict without physical violence.   Avoid loud noises which may impair hearing.   Limit television  and computer time to 2 hours per day. Individuals who engage in excessive sedentary activity are more likely to become overweight.  RISK BEHAVIORS  Sexually active Tanya Chapman adults need  to take precautions against pregnancy and sexually transmitted infections. Talk to Tanya Chapman adults about contraception.   Provide a tobacco-free and drug-free environment for the Tanya Chapman adult. Talk to the Tanya Chapman adult about drug, tobacco, and alcohol use among friends or at friends' homes. Make sure the Tanya Chapman adult knows that smoking tobacco or marijuana and taking drugs have health consequences and may impact brain development.   Teach the Tanya Chapman adult about appropriate use of over-the-counter or prescription medicines.   Establish guidelines for driving and for riding with friends.   Talk to Tanya Chapman adults about the risks of drinking and driving or boating. Encourage the Tanya Chapman adult to call you if he or she or friends have been drinking or using drugs.   Remind Tanya Chapman adults to wear seat belts at all times in cars and life vests in boats.   Tanya Chapman adults should always wear a properly fitted helmet when they are riding a bicycle.   Use caution with all-terrain vehicles (ATVs) or other motorized vehicles.   Do not keep handguns in the home. (If you do, the gun and ammunition should be locked separately and out of the Tanya Chapman adult's access.)   Equip your home with smoke detectors and change the batteries regularly. Make sure all family members know the fire escape plans for your home.   Teach Tanya Chapman adults not to swim alone and not to dive in shallow water.   All individuals should wear sunscreen that protects against UVA and UVB light with at least a sun protection factor (SPF) of 30 when out in the sun. This minimizes sun burning.  WHAT'S NEXT? Tanya Chapman adults should visit their pediatrician or family physician yearly. By Tanya Chapman adulthood, health care should be transitioned to a family physician or internal medicine specialist. Sexually active females may want to begin annual physical exams with a gynecologist. Document Released: 04/22/2006 Document Revised: 01/14/2011 Document Reviewed:  05/12/2006 [facility] Patient Information 2012 [place], [place].

## 2011-10-21 NOTE — Progress Notes (Signed)
Tanya Chapman 1990/04/28 [id number]    History:    The patient presents for new patient annual exam.  Monthly 7 day cycles with menorrhagia. Condoms, Depo-Provera in the past, problems with spotting and bleeding while on, trouble remembering pills. Did not receive gardasil. No Pap history. History of hyperthyroidism,  thyroidectomy, 2012 currently on Synthroid 125 mcg daily. Received Tdap  2012. Same partner years/ negative GC/Chl cultures 06/2011.   Past medical history, past surgical history, family history and social history were all reviewed and documented in the EPIC chart. Works at a [employer]. 2 daughters Aalani 3 and Chicken 1, delivered by Dr. Gaynell Face, [facility] with first pregnancy.   ROS:  A  ROS was performed and pertinent positives and negatives are included in the history.  Exam:  Filed Vitals:   10/21/11 1107  BP: 112/70    General appearance:  Normal Head/Neck:  Normal, without cervical or supraclavicular adenopathy. Thyroid:  Symmetrical, normal in size, without palpable masses or nodularity. Respiratory  Effort:  Normal  Auscultation:  Clear without wheezing or rhonchi Cardiovascular  Auscultation:  Regular rate, without rubs, murmurs or gallops  Edema/varicosities:  Not grossly evident Abdominal  Soft,nontender, without masses, guarding or rebound.  Liver/spleen:  No organomegaly noted  Hernia:  None appreciated  Skin  Inspection:  Grossly normal  Palpation:  Grossly normal Neurologic/psychiatric  Orientation:  Normal with appropriate conversation.  Mood/affect:  Normal  Genitourinary    Breasts: Examined lying and sitting.     Right: Without masses, retractions, discharge or axillary adenopathy.     Left: Without masses, retractions, discharge or axillary adenopathy.   Inguinal/mons:  Normal without inguinal adenopathy  External genitalia:  Normal  BUS/Urethra/Skene's glands:  Normal  Bladder:  Normal  Vagina:  Normal  Cervix:  Normal /  menses  Uterus:   normal in size, shape and contour.  Midline and mobile  Adnexa/parametria:     Rt: Without masses or tenderness.   Lt: Without masses or tenderness.  Anus and perineum: Normal  Digital rectal exam: Normal sphincter tone without palpated masses or tenderness  Assessment/Plan:  21 y.o. SBF G4 P2 for annual exam requesting contraception.  Contraception management Gardasil  Plan: Gardasil reviewed, first injection given instructed to return to office in 2 and 6 months. Contraception options reviewed, Mirena IUD will check coverage, place with cycle with Dr. Audie Box. Handout given and reviewed slight risk for infection, hemorrhage, perforation. SBE's, exercise, calcium rich diet, MVI daily encouraged. CBC, UA, Pap with HR HPV typing. New Pap screening guidelines reviewed.    Harrington Challenger [facility], 11:58 AM 10/21/2011

## 2011-10-21 NOTE — Addendum Note (Signed)
Addended by: Leonard Schwartz A on: 10/21/2011 02:27 PM   Modules accepted: Orders

## 2011-10-22 ENCOUNTER — Encounter: Payer: Self-pay | Admitting: Gynecology

## 2011-10-22 ENCOUNTER — Ambulatory Visit (INDEPENDENT_AMBULATORY_CARE_PROVIDER_SITE_OTHER): Payer: Managed Care, Other (non HMO) | Admitting: Gynecology

## 2011-10-22 ENCOUNTER — Other Ambulatory Visit: Payer: Self-pay | Admitting: Gynecology

## 2011-10-22 VITALS — BP 108/74

## 2011-10-22 DIAGNOSIS — Z309 Encounter for contraceptive management, unspecified: Secondary | ICD-10-CM

## 2011-10-22 DIAGNOSIS — N920 Excessive and frequent menstruation with regular cycle: Secondary | ICD-10-CM

## 2011-10-22 DIAGNOSIS — IMO0001 Reserved for inherently not codable concepts without codable children: Secondary | ICD-10-CM

## 2011-10-22 DIAGNOSIS — Z30430 Encounter for insertion of intrauterine contraceptive device: Secondary | ICD-10-CM

## 2011-10-22 DIAGNOSIS — Z3049 Encounter for surveillance of other contraceptives: Secondary | ICD-10-CM

## 2011-10-22 DIAGNOSIS — N946 Dysmenorrhea, unspecified: Secondary | ICD-10-CM | POA: Insufficient documentation

## 2011-10-22 LAB — URINALYSIS W MICROSCOPIC + REFLEX CULTURE
Casts: NONE SEEN
Crystals: NONE SEEN
Glucose, UA: NEGATIVE mg/dL
Leukocytes, UA: NEGATIVE
Nitrite: NEGATIVE
Specific Gravity, Urine: 1.012 (ref 1.005–1.030)
Squamous Epithelial / LPF: NONE SEEN
pH: 5.5 (ref 5.0–8.0)

## 2011-10-22 MED ORDER — LEVONORGESTREL 20 MCG/24HR IU IUD: INTRAUTERINE_SYSTEM | Freq: Once | INTRAUTERINE | Status: DC

## 2011-10-22 NOTE — Patient Instructions (Signed)
Intrauterine Device Information An intrauterine device (IUD) is inserted into your uterus and prevents pregnancy. There are 2 types of IUDs available:  Copper IUD. This type of IUD is wrapped in copper wire and is placed inside the uterus. Copper makes the uterus and fallopian tubes produce a fluid that kills sperm. The copper IUD can stay in place for 10 years.   Hormone IUD. This type of IUD contains the hormone progestin (synthetic progesterone). The hormone thickens the cervical mucus and prevents sperm from entering the uterus, and it also thins the uterine lining to prevent implantation of a fertilized egg. The hormone can weaken or kill the sperm that get into the uterus. The hormone IUD can stay in place for 5 years.  Your caregiver will make sure you are a good candidate for a contraceptive IUD. Discuss with your caregiver the possible side effects. ADVANTAGES  It is highly effective, reversible, long-acting, and low maintenance.   There are no estrogen-related side effects.   An IUD can be used when breastfeeding.   It is not associated with weight gain.   It works immediately after insertion.   The copper IUD does not interfere with your female hormones.   The progesterone IUD can make heavy menstrual periods lighter.   The progesterone IUD can be used for 5 years.   The copper IUD can be used for 10 years.  DISADVANTAGES  The progesterone IUD can be associated with irregular bleeding patterns.   The copper IUD can make your menstrual flow heavier and more painful.   You may experience cramping and vaginal bleeding after insertion.  Document Released: 12/30/2003 Document Revised: 01/14/2011 Document Reviewed: 05/30/2010 [employer] Patient Information 2012 [employer]. 

## 2011-10-22 NOTE — Progress Notes (Signed)
21 year old presented to the office today for placement of Mirena IUD. She's currently menstruating. She was seen in the office for annual exam yesterday. Patient has had used condoms, and Depo-Provera and a complaint of spotting while she was on these methods for contraception. Her periods last for approximately 7 days and are very heavy. She had a negative GC and Chlamydia culture in May of this year.  Patient was counseled as to the risks benefits and pros and cons of the Mirena IUD. Patient had read the literature information. Consent form was signed. Patient fully where this form of contraception is good for 5 years and is 99% effective.  Procedure note: Pelvic exam: Bartholin urethra Skene was within normal limits Vagina: No lesions or discharge menstrual blood present Cervix: No lesions or discharge Uterus normal size shape and consistency retroverted Adnexa: No palpable masses or tenderness Rectal exam: Not done  The cervix was cleansed with Betadine solution. A single tenaculum was placed on the anterior cervical lip. The uterus sounded to 7 cm. The Mirena IUD was placed in sterile fashion and the cord was trimmed. Patient was given Aleve 1 tablet for some mild cramping she'll experience. Patient will return back to the office in 2 months for followup at which time she will be due for her second shot of the Gardasil Vaccine.

## 2011-10-23 LAB — URINE CULTURE: Colony Count: 25000

## 2011-10-29 ENCOUNTER — Telehealth: Payer: Self-pay | Admitting: Women's Health

## 2011-10-29 ENCOUNTER — Other Ambulatory Visit: Payer: Self-pay | Admitting: Women's Health

## 2011-10-29 DIAGNOSIS — D649 Anemia, unspecified: Secondary | ICD-10-CM

## 2011-10-29 NOTE — Telephone Encounter (Signed)
I called patient and informed her of lab results and need to take another pap at IUD check visit.  Patient c/o a watery discharge since having Mirena inserted. No odor, burning or itching.  Having to wear a pad. Normal?

## 2011-10-29 NOTE — Telephone Encounter (Signed)
Message left to call

## 2011-10-29 NOTE — Telephone Encounter (Signed)
Message copied by Keenan Bachelor on Fri Oct 29, 2011  3:18 PM ------      Message from: [place], [place] J      Created: Fri Oct 29, 2011  8:06 AM       Please call patient and inform CBC, H&H low at 10.9 and 31.9. Instruct to increase iron rich foods, take a multivitamin that has iron in it and recheck in 3 months. Also inform Pap had minimal cells. Repeat Pap at office visit to check IUD.

## 2011-11-01 NOTE — Telephone Encounter (Signed)
Phone call, states is changing a mini pad 4-5 times a day for a watery yellow discharge denies itching or odor. Will schedule office visit this week IUD strings checked and wet prep

## 2011-12-23 ENCOUNTER — Ambulatory Visit: Payer: Managed Care, Other (non HMO)

## 2011-12-23 ENCOUNTER — Ambulatory Visit: Payer: Managed Care, Other (non HMO) | Admitting: Gynecology

## 2012-01-14 ENCOUNTER — Ambulatory Visit (INDEPENDENT_AMBULATORY_CARE_PROVIDER_SITE_OTHER): Payer: Managed Care, Other (non HMO)

## 2012-01-14 ENCOUNTER — Encounter: Payer: Self-pay | Admitting: Gynecology

## 2012-01-14 ENCOUNTER — Ambulatory Visit (INDEPENDENT_AMBULATORY_CARE_PROVIDER_SITE_OTHER): Payer: Managed Care, Other (non HMO) | Admitting: Gynecology

## 2012-01-14 ENCOUNTER — Other Ambulatory Visit (HOSPITAL_COMMUNITY)
Admission: RE | Admit: 2012-01-14 | Discharge: 2012-01-14 | Disposition: A | Discharge status: Home / Self Care | Payer: Managed Care, Other (non HMO) | Source: Ambulatory Visit | Attending: Gynecology | Admitting: Gynecology

## 2012-01-14 VITALS — BP 120/78

## 2012-01-14 DIAGNOSIS — E039 Hypothyroidism, unspecified: Secondary | ICD-10-CM

## 2012-01-14 DIAGNOSIS — N949 Unspecified condition associated with female genital organs and menstrual cycle: Secondary | ICD-10-CM

## 2012-01-14 DIAGNOSIS — N938 Other specified abnormal uterine and vaginal bleeding: Secondary | ICD-10-CM

## 2012-01-14 DIAGNOSIS — T8332XA Displacement of intrauterine contraceptive device, initial encounter: Secondary | ICD-10-CM

## 2012-01-14 DIAGNOSIS — Z01419 Encounter for gynecological examination (general) (routine) without abnormal findings: Secondary | ICD-10-CM | POA: Insufficient documentation

## 2012-01-14 DIAGNOSIS — Z30431 Encounter for routine checking of intrauterine contraceptive device: Secondary | ICD-10-CM

## 2012-01-14 DIAGNOSIS — D649 Anemia, unspecified: Secondary | ICD-10-CM

## 2012-01-14 DIAGNOSIS — R87616 Satisfactory cervical smear but lacking transformation zone: Secondary | ICD-10-CM

## 2012-01-14 LAB — CBC WITH DIFFERENTIAL/PLATELET
Basophils Absolute: 0.1 10*3/uL (ref 0.0–0.1)
HCT: 32 % — ABNORMAL LOW (ref 36.0–46.0)
Lymphocytes Relative: 36 % (ref 12–46)
Lymphs Abs: 1.8 10*3/uL (ref 0.7–4.0)
Monocytes Absolute: 0.4 10*3/uL (ref 0.1–1.0)
Neutro Abs: 2.6 10*3/uL (ref 1.7–7.7)
RBC: 4.05 MIL/uL (ref 3.87–5.11)
RDW: 16.1 % — ABNORMAL HIGH (ref 11.5–15.5)
WBC: 5 10*3/uL (ref 4.0–10.5)

## 2012-01-14 MED ORDER — NORGESTIMATE-ETH ESTRADIOL 0.25-35 MG-MCG PO TABS: 1.0000 | ORAL_TABLET | Freq: Every day | ORAL | Status: DC

## 2012-01-14 MED ORDER — DOXYCYCLINE HYCLATE 100 MG PO CAPS: 100.0000 mg | ORAL_CAPSULE | Freq: Two times a day (BID) | ORAL | Status: DC

## 2012-01-14 NOTE — Patient Instructions (Addendum)
Oral Contraception Use Oral contraceptives (OCs) are medicines taken to prevent pregnancy. OCs work by preventing the ovaries from releasing eggs. The hormones in OCs also cause the cervical mucus to thicken, preventing the sperm from entering the uterus. The hormones also cause the uterine lining to become thin, not allowing a fertilized egg to attach to the inside of the uterus. OCs are highly effective when taken exactly as prescribed. However, OCs do not prevent sexually transmitted diseases (STDs). Safe sex practices, such as using condoms along with an OC, can help prevent STDs.  Before taking OCs, you may have a physical exam and Pap test. Your caregiver may also order blood tests if necessary. Your caregiver will make sure you are a good candidate for oral contraception. Discuss with your caregiver the possible side effects of the OC you may be prescribed. When starting an OC, it can take 2 to 3 months for the body to adjust to the changes in hormone levels in your body.  HOW TO TAKE ORAL CONTRACEPTIVES Your caregiver may advise you on how to start taking the first cycle of OCs. Otherwise, you can:  Start on day 1 of your menstrual period. You will not need any backup contraceptive protection with this start time.  Start on the first Sunday after your menstrual period or the day you get your prescription. In these cases, you will need to use backup contraceptive protection for the first 7-day cycle. After you have started taking OCs:  If you forget to take 1 pill, take it as soon as you remember. Take the next pill at the regular time.  If you miss 2 or more pills, use backup birth control until your next menstrual period starts.  If you use a 28-day pack that contains inactive pills and you miss 1 of the last 7 pills (pills with no hormones), it will not matter. Throw away the rest of the non-hormone pills and start a new pill pack. No matter which day you start the OC, you will always start  a new pack on that same day of the week. Have an extra pack of OCs and a backup contraceptive method available in case you miss some pills or lose your OC pack. HOME CARE INSTRUCTIONS   Do not smoke.  Always use a condom to protect against STDs. OCs do not protect against STDs.  Use a calendar to mark your menstrual period days.  Read the information and directions that come with your OC. Talk to your caregiver if you have questions. SEEK MEDICAL CARE IF:   You develop nausea and vomiting.  You have abnormal vaginal discharge or bleeding.  You develop a rash.  You miss your menstrual period.  You are losing your hair.  You need treatment for mood swings or depression.  You get dizzy when taking the OC.  You develop acne from taking the OC.  You become pregnant. SEEK IMMEDIATE MEDICAL CARE IF:   You develop chest pain.  You develop shortness of breath.  You have an uncontrolled or severe headache.  You develop numbness or slurred speech.  You develop visual problems.  You develop pain, redness, and swelling in the legs. Document Released: 01/14/2011 Document Revised: 04/19/2011 Document Reviewed: 01/14/2011 Exit[employer] Patient Information 2013 [employer].  

## 2012-01-14 NOTE — Progress Notes (Signed)
Patient is a 21 year old who was seen in the office in September 13 whereby she had a Mirena IUD placed for contraception and her menorrhagia. Patient had used condoms in Depo-Provera in the past. Patient stated this is the IUD is in place she's had bleeding recently a lot of low pelvic pressure. Patient has history of hypothyroidism for which she's currently on levothyroxine 100 mcg daily and her thyroid function tests are been monitored for by Dr. Margaretmary Bayley her internist. Patient received the first of a series of 3 Gardasil Vaccine injections at the last office visit and was to receive the second dose today.  Exam: Bartholin urethra Skene glands within normal limits vagina: Menstrual blood present Cervix: Tip of IUD was noted at the external cervical os Uterus: Anteverted some mild cramping elicited. No palpable adnexal masses  Urine pregnancy test negative Ultrasound uterus measures 7.3 x 4.8 x 3.8 cm with endometrial stripe 3 mm. Normal-appearing ovaries. Trace amount of free fluid in the cul-de-sac.  Assessment/plan: Partial expulsion of Mirena IUD. The IUD was removed shown to the patient discarded before the ultrasound. In the event of a possible endometritis she will be placed on Vibramycin 100 mg one by mouth twice a day for 7 days. She would like to go on oral contraceptive pills again we discussed importance of compliance. She'll be started on Sprintec 1 tablet daily today. Patient denies any family history of any bleeding disorders. Patient aware that there is a slight risk of DVT and pulmonary embolism in patients on any form of hormone therapy. CBC will be drawn today. She had left the office before she got her second dose of the Gardasil Vaccine so we'll have her come back to receive it and she will need her final dose in April 2014. At time of her recent Pap smear insufficient cells were documented by the pathologist so Pap smear was repeated today.

## 2012-01-17 ENCOUNTER — Encounter: Payer: Self-pay | Admitting: Gynecology

## 2012-01-17 ENCOUNTER — Telehealth: Payer: Self-pay | Admitting: Gynecology

## 2012-01-17 ENCOUNTER — Other Ambulatory Visit: Payer: Self-pay | Admitting: Gynecology

## 2012-01-17 DIAGNOSIS — D649 Anemia, unspecified: Secondary | ICD-10-CM

## 2012-01-17 NOTE — Telephone Encounter (Signed)
Patient had Mirena removed on Friday and you prescribed Doxycycline.  When she picked it up she realized that was medication that cause her to break out with rash before.  She has not taken it and will not take it. She wanted to check and see if you wanted to prescribe something else.  (I have abstracted chart so that it will reflect allergy to Doxy.)

## 2012-01-17 NOTE — Telephone Encounter (Signed)
Please call in Keflex 500 mg one PO BID for 5 days # 10

## 2012-01-18 ENCOUNTER — Other Ambulatory Visit: Payer: Self-pay | Admitting: Gynecology

## 2012-01-18 MED ORDER — CEPHALEXIN 500 MG PO CAPS: 500.0000 mg | ORAL_CAPSULE | Freq: Two times a day (BID) | ORAL | Status: DC

## 2012-01-19 NOTE — Telephone Encounter (Signed)
Rx was called in to pharmacy yesterday.  I left message for patient today to let her know Rx at pharmacy and asked her to call and let me know she got my message.

## 2012-01-20 NOTE — Telephone Encounter (Signed)
Left message for patient to call me to let me know she got my message and her Rx.

## 2012-06-08 ENCOUNTER — Encounter (HOSPITAL_COMMUNITY): Payer: Self-pay | Admitting: *Deleted

## 2012-06-08 ENCOUNTER — Inpatient Hospital Stay (HOSPITAL_COMMUNITY)
Admission: AD | Admit: 2012-06-08 | Discharge: 2012-06-08 | Disposition: A | Discharge status: Home / Self Care | Payer: Managed Care, Other (non HMO) | Source: Ambulatory Visit | Attending: Obstetrics | Admitting: Obstetrics

## 2012-06-08 DIAGNOSIS — Z3201 Encounter for pregnancy test, result positive: Secondary | ICD-10-CM

## 2012-06-08 DIAGNOSIS — E039 Hypothyroidism, unspecified: Secondary | ICD-10-CM | POA: Insufficient documentation

## 2012-06-08 DIAGNOSIS — R079 Chest pain, unspecified: Secondary | ICD-10-CM | POA: Insufficient documentation

## 2012-06-08 DIAGNOSIS — R109 Unspecified abdominal pain: Secondary | ICD-10-CM | POA: Insufficient documentation

## 2012-06-08 DIAGNOSIS — IMO0002 Reserved for concepts with insufficient information to code with codable children: Secondary | ICD-10-CM | POA: Insufficient documentation

## 2012-06-08 DIAGNOSIS — R51 Headache: Secondary | ICD-10-CM | POA: Insufficient documentation

## 2012-06-08 HISTORY — DX: Carrier or suspected carrier of methicillin resistant Staphylococcus aureus: Z22.322

## 2012-06-08 LAB — URINALYSIS, ROUTINE W REFLEX MICROSCOPIC
Leukocytes, UA: NEGATIVE
Nitrite: NEGATIVE
Specific Gravity, Urine: 1.025 (ref 1.005–1.030)
pH: 5.5 (ref 5.0–8.0)

## 2012-06-08 LAB — POCT PREGNANCY, URINE: Preg Test, Ur: POSITIVE — AB

## 2012-06-08 NOTE — MAU Provider Note (Signed)
Chief Complaint: Headache, Abdominal Pain and Chest Pain     SUBJECTIVE HPI: Tanya Chapman is a 22 y.o. [id number] who presents with concern that her  HPT was positive this morning.  now 4 weeks after having a therapeutic abortion @[redacted]wks  [place] on 05/05/2012 BY she is being followed by Dr. Mariane Baumgarten. She returned there at 2 weeks post procedure and had an office ultrasound which she was told was normal and at that point got Depo-Provera for contraception. She has another appointment there for followup in 2 weeks.  She states her bleeding had tapered never completely stopped after the abortion until today when she has no bleeding. She is having lower abdominal cramping today. She has had headaches and right sided chest discomfort she describes as "tightening" intermittently but does not have headache or chest pain now. Has appointment tomorrow with MD who follows her hypothyroidism and she has not been taking her Synthroid.   Past Medical History  Diagnosis Date  . Thyroid disease     hyperthyroid   . Weight loss, unintentional   . Trouble swallowing   . Visual disturbance     eyes bulging, blurry, and hurting   . Generalized headaches     migraines and generalized   . Fatigue   . Hyperthyroidism   . MRSA carrier    OB History   Grav Para Term Preterm Abortions TAB SAB Ect Mult Living   5 1 1  0 2 2 0 0 0 2     # Outc Date GA Lbr Len/2nd Wgt Sex Del Anes PTL Lv   1 GRA            Comments: System Generated. Please review and update pregnancy details.   2 GRA            Comments: System Generated. Please review and update pregnancy details.   3 TAB            4 TRM            5 TAB              Past Surgical History  Procedure Laterality Date  . Hernia repair  1996  . Cesarean section  11/22/08  . Total thyroidectomy  11/26/10   History   Social History  . Marital Status: Single    Spouse Name: N/A    Number of Children: N/A  . Years of Education: N/A    Occupational History  . Not on file.   Social History Main Topics  . Smoking status: Never Smoker   . Smokeless tobacco: Never Used  . Alcohol Use: Yes  . Drug Use: No  . Sexually Active: Yes    Birth Control/ Protection: Condom     Comment: Relapsed on Depo shot. It was due 2 weeks and patient did not get it.    Other Topics Concern  . Not on file   Social History Narrative  . No narrative on file   No current facility-administered medications on file prior to encounter.   Current Outpatient Prescriptions on File Prior to Encounter  Medication Sig Dispense Refill  . levothyroxine (SYNTHROID, LEVOTHROID) 100 MCG tablet Take 100 mcg by mouth daily.        Allergies  Allergen Reactions  . Doxycycline Rash    ROS: Pertinent items in HPI  OBJECTIVE Blood pressure 107/65, pulse 105, temperature 97.7 F (36.5 C), temperature source Oral, resp. rate 16, height 5\' 4"  (1.626 m), weight  156 lb (70.761 kg). GENERAL: Well-developed, well-nourished female in no acute distress.  HEENT: Normocephalic HEART: normal rate RESP: normal effort ABDOMEN: Soft, non-tender EXTREMITIES: Nontender, no edema NEURO: Alert and oriented BIMANUAL: cervix L/C, mobile; uterus normal size, no adnexal tenderness or masses; no blood noted  LAB RESULTS Results for orders placed during the hospital encounter of 06/08/12 (from the past 24 hour(s))  URINALYSIS, ROUTINE W REFLEX MICROSCOPIC     Status: None   Collection Time    06/08/12 10:35 AM      Result Value Range   Color, Urine YELLOW  YELLOW   APPearance CLEAR  CLEAR   Specific Gravity, Urine 1.025  1.005 - 1.030   pH 5.5  5.0 - 8.0   Glucose, UA NEGATIVE  NEGATIVE mg/dL   Hgb urine dipstick NEGATIVE  NEGATIVE   Bilirubin Urine NEGATIVE  NEGATIVE   Ketones, ur NEGATIVE  NEGATIVE mg/dL   Protein, ur NEGATIVE  NEGATIVE mg/dL   Urobilinogen, UA 0.2  0.0 - 1.0 mg/dL   Nitrite NEGATIVE  NEGATIVE   Leukocytes, UA NEGATIVE  NEGATIVE  POCT  PREGNANCY, URINE     Status: Abnormal   Collection Time    06/08/12 10:47 AM      Result Value Range   Preg Test, Ur POSITIVE (*) NEGATIVE    IMAGING No results found.  MAU COURSE D/W Dr. Jolayne Panther and offered her quantitative beta hCG for reassurance > she declines and elects to F/U with her thyroid doctor tomorrow  ASSESSMENT 1. Positive pregnancy test   2. Hx of vaginal bleeding   4 wks post EAB Bleeding due to new start Depoprovera contraception Hypothyroidism  PLAN Discharge home with reassurance of normal pelvic exam and UPT positive due to her recent pregnancy. Expect it will revert to negative by next visit.    Medication List    TAKE these medications       levothyroxine 100 MCG tablet  Commonly known as:  SYNTHROID, LEVOTHROID  Take 100 mcg by mouth daily.       Follow-up Information   Follow up In 2 weeks. (Follow up with your primary care doctors as scheduled)       Danae Orleans, CNM 06/08/2012  11:02 AM

## 2012-06-08 NOTE — MAU Note (Signed)
Pt is not having any bleedingnow;not having any chest painnow; not having headache now; concerned about still having a +upt and is worried that there is part of the pregnancy still in her uterus;

## 2012-06-08 NOTE — MAU Note (Signed)
C/o having headache; c/o abdominal cramping; had TAB on March 28th and is stilll having +upt; is on Depo now and got 1st shot in middle of April; not having chest pain now but had some chest pain yesterday and this AM earlier; chest pain was described as chest tighteness;

## 2012-06-09 NOTE — MAU Provider Note (Signed)
Attestation of Attending Supervision of Advanced Practitioner (CNM/NP): Evaluation and management procedures were performed by the Advanced Practitioner under my supervision and collaboration.  I have reviewed the Advanced Practitioner's note and chart, and I agree with the management and plan.  Recia Sons 06/09/2012 7:39 AM

## 2012-10-26 ENCOUNTER — Ambulatory Visit (INDEPENDENT_AMBULATORY_CARE_PROVIDER_SITE_OTHER): Payer: Managed Care, Other (non HMO) | Admitting: Women's Health

## 2012-10-26 ENCOUNTER — Encounter: Payer: Self-pay | Admitting: Women's Health

## 2012-10-26 VITALS — BP 114/74 | Ht 64.0 in | Wt 164.6 lb

## 2012-10-26 DIAGNOSIS — E0590 Thyrotoxicosis, unspecified without thyrotoxic crisis or storm: Secondary | ICD-10-CM

## 2012-10-26 DIAGNOSIS — N898 Other specified noninflammatory disorders of vagina: Secondary | ICD-10-CM

## 2012-10-26 DIAGNOSIS — Z113 Encounter for screening for infections with a predominantly sexual mode of transmission: Secondary | ICD-10-CM

## 2012-10-26 DIAGNOSIS — E049 Nontoxic goiter, unspecified: Secondary | ICD-10-CM

## 2012-10-26 DIAGNOSIS — N926 Irregular menstruation, unspecified: Secondary | ICD-10-CM

## 2012-10-26 DIAGNOSIS — Z01419 Encounter for gynecological examination (general) (routine) without abnormal findings: Secondary | ICD-10-CM

## 2012-10-26 DIAGNOSIS — R82998 Other abnormal findings in urine: Secondary | ICD-10-CM

## 2012-10-26 LAB — WET PREP FOR TRICH, YEAST, CLUE
Clue Cells Wet Prep HPF POC: NONE SEEN
Yeast Wet Prep HPF POC: NONE SEEN

## 2012-10-26 LAB — PREGNANCY, URINE: Preg Test, Ur: NEGATIVE

## 2012-10-26 MED ORDER — NYSTATIN-TRIAMCINOLONE 100000-0.1 UNIT/GM-% EX OINT: TOPICAL_OINTMENT | Freq: Two times a day (BID) | CUTANEOUS | Status: DC

## 2012-10-26 NOTE — Progress Notes (Signed)
Tanya Chapman Aug 14, 1990 [id number]    History:    The patient presents for annual exam requesting urine pregnancy test/negative. Irregular cycles/no contraceptive use/same partner. Has not had a cycle in 2 months but states is normal for her to skip 2 or 3 months between cycles.Noticed some redness and irritation of external genitalia and skin surrounding her vagina. Denies itching or discharge. Had Mirena IUD placed September 2013 but it came out after one month. Elective termination of pregnancy March 2014. Thinks partner may be unfaithful and requests an STD screen. Requesting nexplanon for birth control. Has been off Synthroid  for one month because unable to see endocrinologist due to work schedule. Last Synthroid dose was 175 mcg, hyperthyroid, needed appointment to adjust. 2012 total thyroidectomy for Graves' disease.   Past medical history, past surgical history, family history and social history were all reviewed and documented in the EPIC chart. Graves' disease. Paternal and maternal grandmothers: Diabetes, hypertension. 2 children, [name] 4, [name] 2. Works  7 days a week at [employer]. Father of her children is unemployed, not helpful with children.  ROS:  A  ROS was performed and pertinent positives and negatives are included in the history.  Exam:  Filed Vitals:   10/26/12 1124  BP: 114/74    General appearance:  Normal Head/Neck:  Normal, without cervical or supraclavicular adenopathy. Thyroid:  Symmetrical, normal in size, without palpable masses or nodularity. Respiratory  Effort:  Normal  Auscultation:  Clear without wheezing or rhonchi Cardiovascular  Auscultation:  Regular rate, without rubs, murmurs or gallops  Edema/varicosities:  Not grossly evident Abdominal  Soft,nontender, without masses, guarding or rebound.  Liver/spleen:  No organomegaly noted  Hernia:  None appreciated  Skin  Inspection:  Grossly normal  Palpation:  Grossly  normal Neurologic/psychiatric  Orientation:  Normal with appropriate conversation.  Mood/affect:  Normal  Genitourinary    Breasts: Examined lying and sitting.     Right: Without masses, retractions, discharge or axillary adenopathy.     Left: Without masses, retractions, discharge or axillary adenopathy.   Inguinal/mons:  Normal without inguinal adenopathy  External genitalia:  Slightly erythematous  BUS/Urethra/Skene's glands:  Normal  Bladder:  Normal  Vagina:  Normal wet prep negative,   Cervix:  Normal  Uterus: normal in size, shape and contour.  Midline and mobile  Adnexa/parametria:     Rt: Without masses or tenderness.   Lt: Without masses or tenderness.  Anus and perineum: Normal    Assessment/Plan:  22 y.o. G5 P2 SBF for annual exam.  Vaginal yeast infection STD screen Thyroidectomy, not currently on hormone replacement, followed by Dr. Chestine Spore, endocrinologist. Irregular cycles Contraception management   Plan: Mycolog ointment 30 g, used twice daily for symptom relief, call if no relief. T4, T3 uptake, TSH, HIV, hepatitis C, B, RPR, GC/Chlamydia, UA, urine pregnancy, wet prep. Make followup appointment with endocrinologist, instructed to printout thyroid results to take to appointment. Check insurance coverage of nexplanon, risks of blood clots and strokes discussed. Reviewed importance of condoms or abstinence until nexplanon  placed. Normal Pap in 2013, new screening guidelines reviewed. SBE's, healthy diet/exercise encouraged.  Harrington Challenger WHNP, 12:02 PM 10/26/2012

## 2012-10-26 NOTE — Patient Instructions (Addendum)

## 2012-10-27 ENCOUNTER — Other Ambulatory Visit: Payer: Self-pay | Admitting: Women's Health

## 2012-10-27 LAB — URINALYSIS W MICROSCOPIC + REFLEX CULTURE
Bacteria, UA: NONE SEEN
Casts: NONE SEEN
Hgb urine dipstick: NEGATIVE
Ketones, ur: NEGATIVE mg/dL
Nitrite: NEGATIVE
Urobilinogen, UA: 0.2 mg/dL (ref 0.0–1.0)

## 2012-10-27 LAB — HEPATITIS C ANTIBODY: HCV Ab: NEGATIVE

## 2012-10-27 LAB — HIV ANTIBODY (ROUTINE TESTING W REFLEX): HIV: NONREACTIVE

## 2012-10-27 LAB — GC/CHLAMYDIA PROBE AMP: CT Probe RNA: NEGATIVE

## 2012-10-27 MED ORDER — LEVOTHYROXINE SODIUM 150 MCG PO TABS: 150.0000 ug | ORAL_TABLET | Freq: Every day | ORAL | Status: DC

## 2012-10-31 ENCOUNTER — Telehealth: Payer: Self-pay | Admitting: Gynecology

## 2012-10-31 NOTE — Telephone Encounter (Signed)
10/31/12-LM VM for pt that her ins does cover the Nexplanon and insertion at 100%,no copay. Pt advised needs to be inserted while on cycle/wl

## 2012-11-01 ENCOUNTER — Other Ambulatory Visit: Payer: Managed Care, Other (non HMO)

## 2012-11-01 ENCOUNTER — Other Ambulatory Visit: Payer: Self-pay | Admitting: Women's Health

## 2012-11-01 DIAGNOSIS — N912 Amenorrhea, unspecified: Secondary | ICD-10-CM

## 2012-11-01 DIAGNOSIS — D649 Anemia, unspecified: Secondary | ICD-10-CM

## 2012-11-02 ENCOUNTER — Other Ambulatory Visit: Payer: Self-pay | Admitting: *Deleted

## 2012-11-02 MED ORDER — MEDROXYPROGESTERONE ACETATE 10 MG PO TABS: 10.0000 mg | ORAL_TABLET | Freq: Every day | ORAL | Status: DC

## 2012-11-06 ENCOUNTER — Other Ambulatory Visit: Payer: Self-pay | Admitting: Gynecology

## 2012-11-06 DIAGNOSIS — Z3049 Encounter for surveillance of other contraceptives: Secondary | ICD-10-CM

## 2012-11-30 ENCOUNTER — Telehealth: Payer: Self-pay | Admitting: *Deleted

## 2012-11-30 NOTE — Telephone Encounter (Signed)
Please call and ask if she followed up with endocrinologist. She had been off Synthroid, TSH 89, started back on Synthroid 150 in September. History of thyroidectomy. Have her schedule appointment now, check U PT at home if negative abstain until we can get nexplanon  placed.

## 2012-11-30 NOTE — Telephone Encounter (Signed)
Pt calling c/o no cycle, pt took provera 10 mg x 5 days, pt said her face broke out once taking this medication. It has been 2 weeks and no cycle yet, pt was to have nexplanon placed once cycle starts. Please advise

## 2012-11-30 NOTE — Telephone Encounter (Signed)
Pt has not followed up with endocrinologist, still taking synthroid 150, you want pt to make appointment with endocrinologist?

## 2012-11-30 NOTE — Telephone Encounter (Signed)
Yes, I think she has seen Dr. Kemper Durie in the past. Have her schedule appointment for nexplanon  with Dr. Audie Box, we will check a U PT at that time also.

## 2012-11-30 NOTE — Telephone Encounter (Signed)
Pt informed with the below, pt will make OV with endroc. And schedule placement for nexplanon as well.

## 2012-12-05 ENCOUNTER — Encounter: Payer: Self-pay | Admitting: Gynecology

## 2012-12-05 ENCOUNTER — Ambulatory Visit (INDEPENDENT_AMBULATORY_CARE_PROVIDER_SITE_OTHER): Payer: Managed Care, Other (non HMO) | Admitting: Gynecology

## 2012-12-05 DIAGNOSIS — N926 Irregular menstruation, unspecified: Secondary | ICD-10-CM

## 2012-12-05 DIAGNOSIS — Z3046 Encounter for surveillance of implantable subdermal contraceptive: Secondary | ICD-10-CM

## 2012-12-05 LAB — PREGNANCY, URINE: Preg Test, Ur: NEGATIVE

## 2012-12-05 MED ORDER — ETONOGESTREL 68 MG ~~LOC~~ IMPL: 68.0000 mg | DRUG_IMPLANT | Freq: Once | SUBCUTANEOUS | Status: DC

## 2012-12-05 NOTE — Progress Notes (Signed)
Patient presents for Nexplanon insertion. She previously has been counseled by Harriett Sine.  I reviewed Nexplanon insertional process and the side effects/risks. I reviewed irregular bleeding, insertion site infections, underlying neurovascular damage with permanent sequela, migration of the implant making removal difficult requiring surgery, the need to have it removed in 3 years under a separate procedure and lastly the risk of failure with pregnancy. Patient has been having irregular menses noting her last menstrual period was June 2014. She has been found to be significantly hypothyroid with a recent TSH of 84 and was started on Synthroid.  She has abstained from intercourse for 4 weeks and had a negative serum pregnancy test a month ago and a negative urine pregnancy test today. She is right-handed. She has read through the consent form and signed this.   Procedure with Selena Batten assistant: Left upper arm examined and and marked according to manufacturer's recommendation. The insertion site was cleansed with Betadine solution and the insertional tract infiltrated with 1% lidocaine. The Nexplanon was placed according to manufacturer's recommendation without difficulty. The skin defect was closed with a Steri-Strip. The patient palpated the rod. A pressure dressing was applied and postoperative instructions give her.   Lot number: [id number]   Note: This document was prepared with digital dictation and possible smart phrase technology. Any transcriptional errors that result from this process are unintentional.

## 2012-12-05 NOTE — Patient Instructions (Signed)

## 2013-10-30 ENCOUNTER — Encounter: Payer: Managed Care, Other (non HMO) | Admitting: Women's Health

## 2013-12-10 ENCOUNTER — Encounter: Payer: Self-pay | Admitting: Gynecology

## 2013-12-20 ENCOUNTER — Ambulatory Visit (INDEPENDENT_AMBULATORY_CARE_PROVIDER_SITE_OTHER): Payer: BC Managed Care – PPO | Admitting: Women's Health

## 2013-12-20 ENCOUNTER — Encounter: Payer: Self-pay | Admitting: Women's Health

## 2013-12-20 VITALS — BP 118/80 | Ht 64.0 in | Wt 180.0 lb

## 2013-12-20 DIAGNOSIS — B9689 Other specified bacterial agents as the cause of diseases classified elsewhere: Secondary | ICD-10-CM

## 2013-12-20 DIAGNOSIS — A499 Bacterial infection, unspecified: Secondary | ICD-10-CM

## 2013-12-20 DIAGNOSIS — N760 Acute vaginitis: Secondary | ICD-10-CM

## 2013-12-20 DIAGNOSIS — N898 Other specified noninflammatory disorders of vagina: Secondary | ICD-10-CM

## 2013-12-20 DIAGNOSIS — A5901 Trichomonal vulvovaginitis: Secondary | ICD-10-CM

## 2013-12-20 DIAGNOSIS — N926 Irregular menstruation, unspecified: Secondary | ICD-10-CM

## 2013-12-20 DIAGNOSIS — Z113 Encounter for screening for infections with a predominantly sexual mode of transmission: Secondary | ICD-10-CM

## 2013-12-20 LAB — WET PREP FOR TRICH, YEAST, CLUE: YEAST WET PREP: NONE SEEN

## 2013-12-20 MED ORDER — NORELGESTROMIN-ETH ESTRADIOL 150-35 MCG/24HR TD PTWK: MEDICATED_PATCH | TRANSDERMAL | Status: DC

## 2013-12-20 MED ORDER — METRONIDAZOLE 500 MG PO TABS: ORAL_TABLET | ORAL | Status: DC

## 2013-12-20 MED ORDER — METRONIDAZOLE 0.75 % VA GEL: VAGINAL | Status: DC

## 2013-12-20 NOTE — Patient Instructions (Signed)

## 2013-12-20 NOTE — Progress Notes (Signed)
Patient ID: Tanya Chapman, female   DOB: 01/30/1991, 23 y.o.   MRN: [id number] Presents with complaint of irregular spotting, spots or bleeds most days of the month states has usually only one week of no bleeding monthly since nexplanon  placed 11/2012. Has conceived on IUD and shortly after Depo-Provera. Had cycle and irregular bleeding on Depo-Provera.  New partner. Denies abdominal pain, fever or urinary symptoms. Hypothyroid on Synthroid.  Exam: Appears well. External genitalia within normal limits, speculum exam copious frothy discharge, wet prep positive for amines, Trichomonas, many clues. GC/Chlamydia culture taken. Bimanual no CMT or adnexal fullness or tenderness. No visible blood noted. Bimanual no CMT or adnexal fullness or tenderness.  Trichomonas Irregular bleeding on nexplanon   Plan: Flagyl 2 g by mouth 2 doses. Reviewed importance of both being treated, abstain, alcohol precautions reviewed,call if no relief of discharge. Options reviewed, will try to regulate cycle with Ortho Evra patch one patch weekly for 3 weeks. Instructed to call if continued bleeding.GC/Chlamydia  pending. Will check HIV, hepatitis, RPR at annual exam in 1 month

## 2013-12-21 ENCOUNTER — Other Ambulatory Visit: Payer: Self-pay | Admitting: Women's Health

## 2013-12-21 DIAGNOSIS — A749 Chlamydial infection, unspecified: Secondary | ICD-10-CM

## 2013-12-21 LAB — URINALYSIS W MICROSCOPIC + REFLEX CULTURE
BACTERIA UA: NONE SEEN
BILIRUBIN URINE: NEGATIVE
Casts: NONE SEEN
Crystals: NONE SEEN
GLUCOSE, UA: NEGATIVE mg/dL
HGB URINE DIPSTICK: NEGATIVE
Ketones, ur: NEGATIVE mg/dL
Nitrite: NEGATIVE
PROTEIN: NEGATIVE mg/dL
Specific Gravity, Urine: 1.011 (ref 1.005–1.030)
Squamous Epithelial / LPF: NONE SEEN
UROBILINOGEN UA: 0.2 mg/dL (ref 0.0–1.0)
pH: 6.5 (ref 5.0–8.0)

## 2013-12-21 LAB — GC/CHLAMYDIA PROBE AMP
CT PROBE, AMP APTIMA: POSITIVE — AB
GC PROBE AMP APTIMA: NEGATIVE

## 2013-12-21 MED ORDER — AZITHROMYCIN 500 MG PO TABS: 1000.0000 mg | ORAL_TABLET | Freq: Every day | ORAL | Status: DC

## 2013-12-22 LAB — URINE CULTURE

## 2014-01-15 ENCOUNTER — Other Ambulatory Visit (HOSPITAL_COMMUNITY)
Admission: RE | Admit: 2014-01-15 | Discharge: 2014-01-15 | Disposition: A | Discharge status: Home / Self Care | Payer: BC Managed Care – PPO | Source: Ambulatory Visit | Attending: Gynecology | Admitting: Gynecology

## 2014-01-15 ENCOUNTER — Ambulatory Visit (INDEPENDENT_AMBULATORY_CARE_PROVIDER_SITE_OTHER): Payer: BC Managed Care – PPO | Admitting: Women's Health

## 2014-01-15 ENCOUNTER — Encounter: Payer: Self-pay | Admitting: Women's Health

## 2014-01-15 VITALS — BP 118/76 | Ht 65.0 in | Wt 178.0 lb

## 2014-01-15 DIAGNOSIS — Z23 Encounter for immunization: Secondary | ICD-10-CM

## 2014-01-15 DIAGNOSIS — A499 Bacterial infection, unspecified: Secondary | ICD-10-CM

## 2014-01-15 DIAGNOSIS — Z01419 Encounter for gynecological examination (general) (routine) without abnormal findings: Secondary | ICD-10-CM | POA: Diagnosis present

## 2014-01-15 DIAGNOSIS — B9689 Other specified bacterial agents as the cause of diseases classified elsewhere: Secondary | ICD-10-CM

## 2014-01-15 DIAGNOSIS — Z113 Encounter for screening for infections with a predominantly sexual mode of transmission: Secondary | ICD-10-CM

## 2014-01-15 DIAGNOSIS — N760 Acute vaginitis: Secondary | ICD-10-CM

## 2014-01-15 LAB — URINALYSIS W MICROSCOPIC + REFLEX CULTURE
Bilirubin Urine: NEGATIVE
Glucose, UA: NEGATIVE mg/dL
Hgb urine dipstick: NEGATIVE
Ketones, ur: NEGATIVE mg/dL
LEUKOCYTES UA: NEGATIVE
NITRITE: NEGATIVE
PROTEIN: NEGATIVE mg/dL
SPECIFIC GRAVITY, URINE: 1.015 (ref 1.005–1.030)
UROBILINOGEN UA: 0.2 mg/dL (ref 0.0–1.0)
pH: 6 (ref 5.0–8.0)

## 2014-01-15 LAB — WET PREP FOR TRICH, YEAST, CLUE
TRICH WET PREP: NONE SEEN
YEAST WET PREP: NONE SEEN

## 2014-01-15 MED ORDER — METRONIDAZOLE 0.75 % VA GEL: VAGINAL | Status: DC

## 2014-01-15 NOTE — Progress Notes (Signed)
Judd Gaudierameisha S Mittag 11/09/1990 [id number]    History:    Presents for annual exam.  Rare bleeding Nexplanon placed 11/2012. 2012 thyroidectomy on Synthroid per endocrinologist. One gardasil.  Past medical history, past surgical history, family history and social history were all reviewed and documented in the EPIC chart. 2 daughters ages 625 and 243, father of children not involved. Grandparents diabetes and hypertension. Parents healthy. Works full-time at [employer].  ROS:  A  12 point ROS was performed and pertinent positives and negatives are included.  Exam:  Filed Vitals:   01/15/14 1424  BP: 118/76    General appearance:  Normal Thyroid:  Symmetrical, normal in size, without palpable masses or nodularity. Respiratory  Auscultation:  Clear without wheezing or rhonchi Cardiovascular  Auscultation:  Regular rate, without rubs, murmurs or gallops  Edema/varicosities:  Not grossly evident Abdominal  Soft,nontender, without masses, guarding or rebound.  Liver/spleen:  No organomegaly noted  Hernia:  None appreciated  Skin  Inspection:  Grossly normal   Breasts: Examined lying and sitting.     Right: Without masses, retractions, discharge or axillary adenopathy.     Left: Without masses, retractions, discharge or axillary adenopathy. Gentitourinary   Inguinal/mons:  Normal without inguinal adenopathy  External genitalia:  Normal  BUS/Urethra/Skene's glands:  Normal  Vagina:  Normal  Cervix:  Normal  Uterus:   normal in size, shape and contour.  Midline and mobile  Adnexa/parametria:     Rt: Without masses or tenderness.   Lt: Without masses or tenderness.  Anus and perineum: Normal  Digital rectal exam: Normal sphincter tone without palpated masses or tenderness  Assessment/Plan:  23 y.o. SBF G3P2 for annual exam.    11/2012 Nexplanon left upper arm 2012 thyroidectomy on Synthroid per endocrinologist STD screen  Plan: CBC, UA, Pap, GC/Chlamydia, HIV, hep B, C, RPR.  Reviewed importance of condoms if sexually active. SBE's, regular exercise, calcium rich diet, decrease calories for weight loss, MVI daily encouraged. Second gardasil given, instructed to return to office in 4 months to complete series    Harrington ChallengerYOUNG,NANCY J C[facility] LPWHNP, 3:39 PM 01/15/2014

## 2014-01-15 NOTE — Patient Instructions (Signed)
Health Maintenance Adopting a healthy lifestyle and getting preventive care can go a long way to promote health and wellness. Talk with your health care provider about what schedule of regular examinations is right for you. This is a good chance for you to check in with your provider about disease prevention and staying healthy. In between checkups, there are plenty of things you can do on your own. Experts have done a lot of research about which lifestyle changes and preventive measures are most likely to keep you healthy. Ask your health care provider for more information. WEIGHT AND DIET  Eat a healthy diet  Be sure to include plenty of vegetables, fruits, low-fat dairy products, and lean protein.  Do not eat a lot of foods high in solid fats, added sugars, or salt.  Get regular exercise. This is one of the most important things you can do for your health.  Most adults should exercise for at least 150 minutes each week. The exercise should increase your heart rate and make you sweat (moderate-intensity exercise).  Most adults should also do strengthening exercises at least twice a week. This is in addition to the moderate-intensity exercise.  Maintain a healthy weight  Body mass index (BMI) is a measurement that can be used to identify possible weight problems. It estimates body fat based on height and weight. Your health care provider can help determine your BMI and help you achieve or maintain a healthy weight.  For females 25 years of age and older:   A BMI below 18.5 is considered underweight.  A BMI of 18.5 to 24.9 is normal.  A BMI of 25 to 29.9 is considered overweight.  A BMI of 30 and above is considered obese.  Watch levels of cholesterol and blood lipids  You should start having your blood tested for lipids and cholesterol at 23 years of age, then have this test every 5 years.  You may need to have your cholesterol levels checked more often if:  Your lipid or  cholesterol levels are high.  You are older than 23 years of age.  You are at high risk for heart disease.  CANCER SCREENING   Lung Cancer  Lung cancer screening is recommended for adults 97-92 years old who are at high risk for lung cancer because of a history of smoking.  A yearly low-dose CT scan of the lungs is recommended for people who:  Currently smoke.  Have quit within the past 15 years.  Have at least a 30-pack-year history of smoking. A pack year is smoking an average of one pack of cigarettes a day for 1 year.  Yearly screening should continue until it has been 15 years since you quit.  Yearly screening should stop if you develop a health problem that would prevent you from having lung cancer treatment.  Breast Cancer  Practice breast self-awareness. This means understanding how your breasts normally appear and feel.  It also means doing regular breast self-exams. Let your health care provider know about any changes, no matter how small.  If you are in your 20s or 30s, you should have a clinical breast exam (CBE) by a health care provider every 1-3 years as part of a regular health exam.  If you are 76 or older, have a CBE every year. Also consider having a breast X-ray (mammogram) every year.  If you have a family history of breast cancer, talk to your health care provider about genetic screening.  If you are  at high risk for breast cancer, talk to your health care provider about having an MRI and a mammogram every year.  Breast cancer gene (BRCA) assessment is recommended for women who have family members with BRCA-related cancers. BRCA-related cancers include:  Breast.  Ovarian.  Tubal.  Peritoneal cancers.  Results of the assessment will determine the need for genetic counseling and BRCA1 and BRCA2 testing. Cervical Cancer Routine pelvic examinations to screen for cervical cancer are no longer recommended for nonpregnant women who are considered low  risk for cancer of the pelvic organs (ovaries, uterus, and vagina) and who do not have symptoms. A pelvic examination may be necessary if you have symptoms including those associated with pelvic infections. Ask your health care provider if a screening pelvic exam is right for you.   The Pap test is the screening test for cervical cancer for women who are considered at risk.  If you had a hysterectomy for a problem that was not cancer or a condition that could lead to cancer, then you no longer need Pap tests.  If you are older than 65 years, and you have had normal Pap tests for the past 10 years, you no longer need to have Pap tests.  If you have had past treatment for cervical cancer or a condition that could lead to cancer, you need Pap tests and screening for cancer for at least 20 years after your treatment.  If you no longer get a Pap test, assess your risk factors if they change (such as having a new sexual partner). This can affect whether you should start being screened again.  Some women have medical problems that increase their chance of getting cervical cancer. If this is the case for you, your health care provider may recommend more frequent screening and Pap tests.  The human papillomavirus (HPV) test is another test that may be used for cervical cancer screening. The HPV test looks for the virus that can cause cell changes in the cervix. The cells collected during the Pap test can be tested for HPV.  The HPV test can be used to screen women 30 years of age and older. Getting tested for HPV can extend the interval between normal Pap tests from three to five years.  An HPV test also should be used to screen women of any age who have unclear Pap test results.  After 23 years of age, women should have HPV testing as often as Pap tests.  Colorectal Cancer  This type of cancer can be detected and often prevented.  Routine colorectal cancer screening usually begins at 23 years of  age and continues through 23 years of age.  Your health care provider may recommend screening at an earlier age if you have risk factors for colon cancer.  Your health care provider may also recommend using home test kits to check for hidden blood in the stool.  A small camera at the end of a tube can be used to examine your colon directly (sigmoidoscopy or colonoscopy). This is done to check for the earliest forms of colorectal cancer.  Routine screening usually begins at age 50.  Direct examination of the colon should be repeated every 5-10 years through 23 years of age. However, you may need to be screened more often if early forms of precancerous polyps or small growths are found. Skin Cancer  Check your skin from head to toe regularly.  Tell your health care provider about any new moles or changes in   moles, especially if there is a change in a mole's shape or color.  Also tell your health care provider if you have a mole that is larger than the size of a pencil eraser.  Always use sunscreen. Apply sunscreen liberally and repeatedly throughout the day.  Protect yourself by wearing long sleeves, pants, a wide-brimmed hat, and sunglasses whenever you are outside. HEART DISEASE, DIABETES, AND HIGH BLOOD PRESSURE   Have your blood pressure checked at least every 1-2 years. High blood pressure causes heart disease and increases the risk of stroke.  If you are between 75 years and 42 years old, ask your health care provider if you should take aspirin to prevent strokes.  Have regular diabetes screenings. This involves taking a blood sample to check your fasting blood sugar level.  If you are at a normal weight and have a low risk for diabetes, have this test once every three years after 23 years of age.  If you are overweight and have a high risk for diabetes, consider being tested at a younger age or more often. PREVENTING INFECTION  Hepatitis B  If you have a higher risk for  hepatitis B, you should be screened for this virus. You are considered at high risk for hepatitis B if:  You were born in a country where hepatitis B is common. Ask your health care provider which countries are considered high risk.  Your parents were born in a high-risk country, and you have not been immunized against hepatitis B (hepatitis B vaccine).  You have HIV or AIDS.  You use needles to inject street drugs.  You live with someone who has hepatitis B.  You have had sex with someone who has hepatitis B.  You get hemodialysis treatment.  You take certain medicines for conditions, including cancer, organ transplantation, and autoimmune conditions. Hepatitis C  Blood testing is recommended for:  Everyone born from [birth year] through 1965.  Anyone with known risk factors for hepatitis C. Sexually transmitted infections (STIs)  You should be screened for sexually transmitted infections (STIs) including gonorrhea and chlamydia if:  You are sexually active and are younger than 23 years of age.  You are older than 23 years of age and your health care provider tells you that you are at risk for this type of infection.  Your sexual activity has changed since you were last screened and you are at an increased risk for chlamydia or gonorrhea. Ask your health care provider if you are at risk.  If you do not have HIV, but are at risk, it may be recommended that you take a prescription medicine daily to prevent HIV infection. This is called pre-exposure prophylaxis (PrEP). You are considered at risk if:  You are sexually active and do not regularly use condoms or know the HIV status of your partner(s).  You take drugs by injection.  You are sexually active with a partner who has HIV. Talk with your health care provider about whether you are at high risk of being infected with HIV. If you choose to begin PrEP, you should first be tested for HIV. You should then be tested every 3 months for  as long as you are taking PrEP.  PREGNANCY   If you are premenopausal and you may become pregnant, ask your health care provider about preconception counseling.  If you may become pregnant, take 400 to 800 micrograms (mcg) of folic acid every day.  If you want to prevent pregnancy, talk to your  health care provider about birth control (contraception). OSTEOPOROSIS AND MENOPAUSE   Osteoporosis is a disease in which the bones lose minerals and strength with aging. This can result in serious bone fractures. Your risk for osteoporosis can be identified using a bone density scan.  If you are 65 years of age or older, or if you are at risk for osteoporosis and fractures, ask your health care provider if you should be screened.  Ask your health care provider whether you should take a calcium or vitamin D supplement to lower your risk for osteoporosis.  Menopause may have certain physical symptoms and risks.  Hormone replacement therapy may reduce some of these symptoms and risks. Talk to your health care provider about whether hormone replacement therapy is right for you.  HOME CARE INSTRUCTIONS   Schedule regular health, dental, and eye exams.  Stay current with your immunizations.   Do not use any tobacco products including cigarettes, chewing tobacco, or electronic cigarettes.  If you are pregnant, do not drink alcohol.  If you are breastfeeding, limit how much and how often you drink alcohol.  Limit alcohol intake to no more than 1 drink per day for nonpregnant women. One drink equals 12 ounces of beer, 5 ounces of wine, or 1 ounces of hard liquor.  Do not use street drugs.  Do not share needles.  Ask your health care provider for help if you need support or information about quitting drugs.  Tell your health care provider if you often feel depressed.  Tell your health care provider if you have ever been abused or do not feel safe at home. Document Released: 08/10/2010  Document Revised: 06/11/2013 Document Reviewed: 12/27/2012 Exit[employer] Patient Information 2015 [employer]. This information is not intended to replace advice given to you by your health care provider. Make sure you discuss any questions you have with your health care provider. Bacterial Vaginosis Bacterial vaginosis is a vaginal infection that occurs when the normal balance of bacteria in the vagina is disrupted. It results from an overgrowth of certain bacteria. This is the most common vaginal infection in women of childbearing age. Treatment is important to prevent complications, especially in pregnant women, as it can cause a premature delivery. CAUSES  Bacterial vaginosis is caused by an increase in harmful bacteria that are normally present in smaller amounts in the vagina. Several different kinds of bacteria can cause bacterial vaginosis. However, the reason that the condition develops is not fully understood. RISK FACTORS Certain activities or behaviors can put you at an increased risk of developing bacterial vaginosis, including:  Having a new sex partner or multiple sex partners.  Douching.  Using an intrauterine device (IUD) for contraception. Women do not get bacterial vaginosis from toilet seats, bedding, swimming pools, or contact with objects around them. SIGNS AND SYMPTOMS  Some women with bacterial vaginosis have no signs or symptoms. Common symptoms include:  Grey vaginal discharge.  A fishlike odor with discharge, especially after sexual intercourse.  Itching or burning of the vagina and vulva.  Burning or pain with urination. DIAGNOSIS  Your health care provider will take a medical history and examine the vagina for signs of bacterial vaginosis. A sample of vaginal fluid may be taken. Your health care provider will look at this sample under a microscope to check for bacteria and abnormal cells. A vaginal pH test may also be done.  TREATMENT  Bacterial vaginosis may  be treated with antibiotic medicines. These may be given in the   form of a pill or a vaginal cream. A second round of antibiotics may be prescribed if the condition comes back after treatment.  HOME CARE INSTRUCTIONS   Only take over-the-counter or prescription medicines as directed by your health care provider.  If antibiotic medicine was prescribed, take it as directed. Make sure you finish it even if you start to feel better.  Do not have sex until treatment is completed.  Tell all sexual partners that you have a vaginal infection. They should see their health care provider and be treated if they have problems, such as a mild rash or itching.  Practice safe sex by using condoms and only having one sex partner. SEEK MEDICAL CARE IF:   Your symptoms are not improving after 3 days of treatment.  You have increased discharge or pain.  You have a fever. MAKE SURE YOU:   Understand these instructions.  Will watch your condition.  Will get help right away if you are not doing well or get worse. FOR MORE INFORMATION  Centers for Disease Control and Prevention, Division of STD Prevention: www.cdc.gov/std American Sexual Health Association (ASHA): www.ashastd.org  Document Released: 01/25/2005 Document Revised: 11/15/2012 Document Reviewed: 09/06/2012 Exit[employer] Patient Information 2015 [employer]. This information is not intended to replace advice given to you by your health care provider. Make sure you discuss any questions you have with your health care provider.  

## 2014-01-16 LAB — GC/CHLAMYDIA PROBE AMP
CT Probe RNA: POSITIVE — AB
GC PROBE AMP APTIMA: NEGATIVE

## 2014-01-17 ENCOUNTER — Other Ambulatory Visit: Payer: Self-pay | Admitting: Women's Health

## 2014-01-17 DIAGNOSIS — A749 Chlamydial infection, unspecified: Secondary | ICD-10-CM

## 2014-01-17 LAB — CYTOLOGY - PAP

## 2014-01-17 MED ORDER — AZITHROMYCIN 500 MG PO TABS: 1000.0000 mg | ORAL_TABLET | Freq: Every day | ORAL | Status: DC

## 2014-02-07 ENCOUNTER — Encounter: Payer: Self-pay | Admitting: Women's Health

## 2014-02-07 ENCOUNTER — Ambulatory Visit (INDEPENDENT_AMBULATORY_CARE_PROVIDER_SITE_OTHER): Payer: BC Managed Care – PPO | Admitting: Women's Health

## 2014-02-07 DIAGNOSIS — B9689 Other specified bacterial agents as the cause of diseases classified elsewhere: Secondary | ICD-10-CM

## 2014-02-07 DIAGNOSIS — B373 Candidiasis of vulva and vagina: Secondary | ICD-10-CM

## 2014-02-07 DIAGNOSIS — B3731 Acute candidiasis of vulva and vagina: Secondary | ICD-10-CM

## 2014-02-07 DIAGNOSIS — Z113 Encounter for screening for infections with a predominantly sexual mode of transmission: Secondary | ICD-10-CM

## 2014-02-07 DIAGNOSIS — A499 Bacterial infection, unspecified: Secondary | ICD-10-CM

## 2014-02-07 DIAGNOSIS — N760 Acute vaginitis: Secondary | ICD-10-CM

## 2014-02-07 LAB — URINALYSIS W MICROSCOPIC + REFLEX CULTURE
BILIRUBIN URINE: NEGATIVE
Casts: NONE SEEN
Crystals: NONE SEEN
GLUCOSE, UA: NEGATIVE mg/dL
Hgb urine dipstick: NEGATIVE
Ketones, ur: NEGATIVE mg/dL
Nitrite: NEGATIVE
PH: 5.5 (ref 5.0–8.0)
Protein, ur: 30 mg/dL — AB
RBC / HPF: NONE SEEN RBC/hpf (ref <3)
Specific Gravity, Urine: 1.015 (ref 1.005–1.030)
Urobilinogen, UA: 0.2 mg/dL (ref 0.0–1.0)

## 2014-02-07 LAB — CBC WITH DIFFERENTIAL/PLATELET
BASOS ABS: 0 10*3/uL (ref 0.0–0.1)
Basophils Relative: 1 % (ref 0–1)
EOS PCT: 5 % (ref 0–5)
Eosinophils Absolute: 0.2 10*3/uL (ref 0.0–0.7)
HCT: 32.2 % — ABNORMAL LOW (ref 36.0–46.0)
Hemoglobin: 10.4 g/dL — ABNORMAL LOW (ref 12.0–15.0)
LYMPHS ABS: 1.8 10*3/uL (ref 0.7–4.0)
LYMPHS PCT: 42 % (ref 12–46)
MCH: 24.8 pg — AB (ref 26.0–34.0)
MCHC: 32.3 g/dL (ref 30.0–36.0)
MCV: 76.8 fL — AB (ref 78.0–100.0)
MONO ABS: 0.3 10*3/uL (ref 0.1–1.0)
MONOS PCT: 7 % (ref 3–12)
MPV: 10.3 fL (ref 8.6–12.4)
NEUTROS ABS: 2 10*3/uL (ref 1.7–7.7)
Neutrophils Relative %: 45 % (ref 43–77)
PLATELETS: 383 10*3/uL (ref 150–400)
RBC: 4.19 MIL/uL (ref 3.87–5.11)
RDW: 15.2 % (ref 11.5–15.5)
WBC: 4.4 10*3/uL (ref 4.0–10.5)

## 2014-02-07 LAB — WET PREP FOR TRICH, YEAST, CLUE
Clue Cells Wet Prep HPF POC: NONE SEEN
Trich, Wet Prep: NONE SEEN

## 2014-02-07 MED ORDER — FLUCONAZOLE 150 MG PO TABS: 150.0000 mg | ORAL_TABLET | Freq: Once | ORAL | Status: DC

## 2014-02-07 NOTE — Addendum Note (Signed)
Addended by: Aura CampsWEBB, Verginia Toohey L on: 02/07/2014 03:35 PM   Modules accepted: Orders

## 2014-02-07 NOTE — Patient Instructions (Signed)

## 2014-02-07 NOTE — Progress Notes (Signed)
Patient ID: Tanya Chapman, female   DOB: 12/04/1990, 23 y.o.   MRN: [id number] Presents for test of cure chlamydia. Has abstained, partner treated, contraceptives with Nexplanon. Due for third and final gardasil March. Denies discharge, abdominal pain, urinary symptoms or fever.  Exam: Appears well. External genitalia within normal limits, speculum exam moderate amount of a white adherent discharge, wet prep positive for yeast, GC/Chlamydia culture taken. Bimanual no CMT or adnexal fullness or tenderness.  Test of cure chlamydia Yeast  Plan: Return to office in March for third and final gardasil schedule appointment. Diflucan 150 by mouth 1 dose, instructed to call if no relief of discharge. GC/Chlamydia culture pending, HIV, hep B, C, RPR. Strongly encouraged condoms until permanent partner.

## 2014-02-08 LAB — HIV ANTIBODY (ROUTINE TESTING W REFLEX): HIV 1&2 Ab, 4th Generation: NONREACTIVE

## 2014-02-08 LAB — GC/CHLAMYDIA PROBE AMP
CT Probe RNA: NEGATIVE
GC Probe RNA: NEGATIVE

## 2014-02-08 LAB — RPR

## 2014-02-08 LAB — HEPATITIS C ANTIBODY: HCV AB: NEGATIVE

## 2014-02-08 LAB — HEPATITIS B SURFACE ANTIGEN: HEP B S AG: NEGATIVE

## 2014-02-09 LAB — URINE CULTURE

## 2014-02-11 ENCOUNTER — Other Ambulatory Visit: Payer: Self-pay | Admitting: Women's Health

## 2014-02-11 DIAGNOSIS — D649 Anemia, unspecified: Secondary | ICD-10-CM

## 2014-02-26 ENCOUNTER — Telehealth: Payer: Self-pay | Admitting: *Deleted

## 2014-02-26 NOTE — Telephone Encounter (Signed)
Message left, 

## 2014-02-26 NOTE — Telephone Encounter (Signed)
Pt was told to take OTC iron supplement due to low hemoglobin, pt c/o constipation due to taking iron supplements. She asked if you have other recommendations? Please advise

## 2014-02-26 NOTE — Telephone Encounter (Signed)
Telephone call, will try over-the-counter MVI with iron in it daily, increase iron rich foods and diet, increase fiber rich foods. Will recheck CBC in a few months.

## 2014-04-18 ENCOUNTER — Ambulatory Visit: Payer: BC Managed Care – PPO

## 2014-11-05 ENCOUNTER — Encounter: Payer: Self-pay | Admitting: Women's Health

## 2014-11-05 ENCOUNTER — Ambulatory Visit (INDEPENDENT_AMBULATORY_CARE_PROVIDER_SITE_OTHER): Payer: Managed Care, Other (non HMO) | Admitting: Women's Health

## 2014-11-05 DIAGNOSIS — A499 Bacterial infection, unspecified: Secondary | ICD-10-CM | POA: Diagnosis not present

## 2014-11-05 DIAGNOSIS — N3000 Acute cystitis without hematuria: Secondary | ICD-10-CM

## 2014-11-05 DIAGNOSIS — N760 Acute vaginitis: Secondary | ICD-10-CM

## 2014-11-05 DIAGNOSIS — B9689 Other specified bacterial agents as the cause of diseases classified elsewhere: Secondary | ICD-10-CM

## 2014-11-05 DIAGNOSIS — Z113 Encounter for screening for infections with a predominantly sexual mode of transmission: Secondary | ICD-10-CM | POA: Diagnosis not present

## 2014-11-05 LAB — URINALYSIS W MICROSCOPIC + REFLEX CULTURE
Bilirubin Urine: NEGATIVE
CASTS: NONE SEEN [LPF]
CRYSTALS: NONE SEEN [HPF]
GLUCOSE, UA: NEGATIVE
HGB URINE DIPSTICK: NEGATIVE
Ketones, ur: NEGATIVE
Nitrite: NEGATIVE
PROTEIN: NEGATIVE
Specific Gravity, Urine: 1.015 (ref 1.001–1.035)
Yeast: NONE SEEN [HPF]
pH: 7 (ref 5.0–8.0)

## 2014-11-05 LAB — WET PREP FOR TRICH, YEAST, CLUE
TRICH WET PREP: NONE SEEN
Yeast Wet Prep HPF POC: NONE SEEN

## 2014-11-05 MED ORDER — CIPROFLOXACIN HCL 250 MG PO TABS
250.0000 mg | ORAL_TABLET | Freq: Two times a day (BID) | ORAL | Status: DC
Start: 2014-11-05 — End: 2015-01-23

## 2014-11-05 MED ORDER — METRONIDAZOLE 500 MG PO TABS: 500.0000 mg | ORAL_TABLET | Freq: Two times a day (BID) | ORAL | Status: DC

## 2014-11-05 MED ORDER — FLUCONAZOLE 150 MG PO TABS: 150.0000 mg | ORAL_TABLET | Freq: Once | ORAL | Status: DC

## 2014-11-05 NOTE — Patient Instructions (Signed)
Bacterial Vaginosis °Bacterial vaginosis is a vaginal infection that occurs when the normal balance of bacteria in the vagina is disrupted. It results from an overgrowth of certain bacteria. This is the most common vaginal infection in women of childbearing age. Treatment is important to prevent complications, especially in pregnant women, as it can cause a premature delivery. °CAUSES  °Bacterial vaginosis is caused by an increase in harmful bacteria that are normally present in smaller amounts in the vagina. Several different kinds of bacteria can cause bacterial vaginosis. However, the reason that the condition develops is not fully understood. °RISK FACTORS °Certain activities or behaviors can put you at an increased risk of developing bacterial vaginosis, including: °· Having a new sex partner or multiple sex partners. °· Douching. °· Using an intrauterine device (IUD) for contraception. °Women do not get bacterial vaginosis from toilet seats, bedding, swimming pools, or contact with objects around them. °SIGNS AND SYMPTOMS  °Some women with bacterial vaginosis have no signs or symptoms. Common symptoms include: °· Grey vaginal discharge. °· A fishlike odor with discharge, especially after sexual intercourse. °· Itching or burning of the vagina and vulva. °· Burning or pain with urination. °DIAGNOSIS  °Your health care provider will take a medical history and examine the vagina for signs of bacterial vaginosis. A sample of vaginal fluid may be taken. Your health care provider will look at this sample under a microscope to check for bacteria and abnormal cells. A vaginal pH test may also be done.  °TREATMENT  °Bacterial vaginosis may be treated with antibiotic medicines. These may be given in the form of a pill or a vaginal cream. A second round of antibiotics may be prescribed if the condition comes back after treatment.  °HOME CARE INSTRUCTIONS  °· Only take over-the-counter or prescription medicines as  directed by your health care provider. °· If antibiotic medicine was prescribed, take it as directed. Make sure you finish it even if you start to feel better. °· Do not have sex until treatment is completed. °· Tell all sexual partners that you have a vaginal infection. They should see their health care provider and be treated if they have problems, such as a mild rash or itching. °· Practice safe sex by using condoms and only having one sex partner. °SEEK MEDICAL CARE IF:  °· Your symptoms are not improving after 3 days of treatment. °· You have increased discharge or pain. °· You have a fever. °MAKE SURE YOU:  °· Understand these instructions. °· Will watch your condition. °· Will get help right away if you are not doing well or get worse. °FOR MORE INFORMATION  °Centers for Disease Control and Prevention, Division of STD Prevention: www.cdc.gov/std °American Sexual Health Association (ASHA): www.ashastd.org  °Document Released: 01/25/2005 Document Revised: 11/15/2012 Document Reviewed: 09/06/2012 °ExitCare® Patient Information ©2015 [employer]. This information is not intended to replace advice given to you by your health care provider. Make sure you discuss any questions you have with your health care provider. °Urinary Tract Infection °Urinary tract infections (UTIs) can develop anywhere along your urinary tract. Your urinary tract is your body's drainage system for removing wastes and extra water. Your urinary tract includes two kidneys, two ureters, a bladder, and a urethra. Your kidneys are a pair of bean-shaped organs. Each kidney is about the size of your fist. They are located below your ribs, one on each side of your spine. °CAUSES °Infections are caused by microbes, which are microscopic organisms, including fungi, viruses, and   bacteria. These organisms are so small that they can only be seen through a microscope. Bacteria are the microbes that most commonly cause UTIs. °SYMPTOMS  °Symptoms of UTIs  may vary by age and gender of the patient and by the location of the infection. Symptoms in young women typically include a frequent and intense urge to urinate and a painful, burning feeling in the bladder or urethra during urination. Older women and men are more likely to be tired, shaky, and weak and have muscle aches and abdominal pain. A fever may mean the infection is in your kidneys. Other symptoms of a kidney infection include pain in your back or sides below the ribs, nausea, and vomiting. °DIAGNOSIS °To diagnose a UTI, your caregiver will ask you about your symptoms. Your caregiver also will ask to provide a urine sample. The urine sample will be tested for bacteria and white blood cells. White blood cells are made by your body to help fight infection. °TREATMENT  °Typically, UTIs can be treated with medication. Because most UTIs are caused by a bacterial infection, they usually can be treated with the use of antibiotics. The choice of antibiotic and length of treatment depend on your symptoms and the type of bacteria causing your infection. °HOME CARE INSTRUCTIONS °· If you were prescribed antibiotics, take them exactly as your caregiver instructs you. Finish the medication even if you feel better after you have only taken some of the medication. °· Drink enough water and fluids to keep your urine clear or pale yellow. °· Avoid caffeine, tea, and carbonated beverages. They tend to irritate your bladder. °· Empty your bladder often. Avoid holding urine for long periods of time. °· Empty your bladder before and after sexual intercourse. °· After a bowel movement, women should cleanse from front to back. Use each tissue only once. °SEEK MEDICAL CARE IF:  °· You have back pain. °· You develop a fever. °· Your symptoms do not begin to resolve within 3 days. °SEEK IMMEDIATE MEDICAL CARE IF:  °· You have severe back pain or lower abdominal pain. °· You develop chills. °· You have nausea or vomiting. °· You have  continued burning or discomfort with urination. °MAKE SURE YOU:  °· Understand these instructions. °· Will watch your condition. °· Will get help right away if you are not doing well or get worse. °Document Released: 11/04/2004 Document Revised: 07/27/2011 Document Reviewed: 03/05/2011 °ExitCare® Patient Information ©2015 [employer]. This information is not intended to replace advice given to you by your health care provider. Make sure you discuss any questions you have with your health care provider. ° °

## 2014-11-05 NOTE — Progress Notes (Signed)
Patient ID: [name], female   DOB: 07-19-1990, 24 y.o.   MRN: [id number] Presents with a 1 week history of malodorous urine with urinary frequency and urgency.  Denies dysuria, fever, back pain, vaginal itching, burning, irritation, or discharge.  Same partner for 8 months and intermittently uses condoms, requests STD screen.  11/2012 Nexplanon  irregular cycles,  LMP was at the beginning of September.  Exam:  Appears well.  External genitalia within normal limits. Speculum exam reveals moderate erythema of the vaginal walls and a moderate amount of white discharge with no odor noted.  Wet prep:  Amine- positive, Clue Cells- moderate, WBC- few, Bacteria- TNTC. GC/Chlamydia culture taken UA: Bld- negative, Nit- negative, Leu- 1+, WBC- 10-20, Bacteria- Moderate  UTI Bacterial Vaginosis STD screen  Plan: Ciprofloxacin 250 mg BID, x3 days for UTI.  Flagyl 500 mg BID, x7 days, informed not to drink alcohol while taking this medication.  Diflucan 150 mg once too be taken if vaginal itching and burning follow antibiotic treatment.  Call if symptoms worsen or do not resolve with treatment. GC/Chlamydia culture pending, HIV, hep B, C, RPR.

## 2014-11-06 LAB — RPR

## 2014-11-06 LAB — GC/CHLAMYDIA PROBE AMP
CT Probe RNA: NEGATIVE
GC Probe RNA: NEGATIVE

## 2014-11-06 LAB — HEPATITIS C ANTIBODY: HCV AB: NEGATIVE

## 2014-11-06 LAB — HIV ANTIBODY (ROUTINE TESTING W REFLEX): HIV 1&2 Ab, 4th Generation: NONREACTIVE

## 2014-11-06 LAB — HEPATITIS B SURFACE ANTIGEN: Hepatitis B Surface Ag: NEGATIVE

## 2014-11-08 LAB — URINE CULTURE

## 2015-01-23 ENCOUNTER — Ambulatory Visit (INDEPENDENT_AMBULATORY_CARE_PROVIDER_SITE_OTHER): Payer: Managed Care, Other (non HMO) | Admitting: Gynecology

## 2015-01-23 ENCOUNTER — Encounter: Payer: Self-pay | Admitting: Gynecology

## 2015-01-23 VITALS — BP 122/80

## 2015-01-23 DIAGNOSIS — Z3049 Encounter for surveillance of other contraceptives: Secondary | ICD-10-CM

## 2015-01-23 DIAGNOSIS — Z3046 Encounter for surveillance of implantable subdermal contraceptive: Secondary | ICD-10-CM

## 2015-01-23 HISTORY — PX: OTHER SURGICAL HISTORY: SHX169

## 2015-01-23 NOTE — Progress Notes (Signed)
Tanya Chapman 06/07/1990 [id number]        24 y.o.  [id number] presents to have her Nexplanon removed. She's had irregular bleeding on and off since placement and wants to go ahead and have it removed.  She wants to go ahead and use Depo-Provera which she has used in the past without difficulty. Has problems remembering pills.  Past medical history,surgical history, problem list, medications, allergies, family history and social history were all reviewed and documented in the EPIC chart.  Directed ROS with pertinent positives and negatives documented in the history of present illness/assessment and plan.  Exam: Kim assistant Filed Vitals:   01/23/15 1041  BP: 122/80   General appearance:  Normal  Procedure: Left upper inner arm examined with Nexplanon rod easily palpated. The skin overlying the distal portion of the rod was cleansed with Betadine and infiltrated with 1% lidocaine. A small skin incision was made at the same scar site as the placement and using superficial dissection with small forceps the distal portion of the rod was worked through the skin incision, grasped and the Nexplanon rod was removed intact, shown to the patient and discarded. A Steri-Strip is placed to reapproximate the skin and a sterile pressure dressing applied. Postoperative instructions given.  Assessment/Plan:  24 y.o. [id number] with successful removal of her Nexplanon rod. Patients can represent over the next week to have her Depo-Provera. Currently not sexually active and the need to not have intercourse until Depo-Provera and use backup contraception for the first month reviewed.    Dara LordsFONTAINE,TIMOTHY P MD, 10:54 AM 01/23/2015

## 2015-01-23 NOTE — Patient Instructions (Signed)
Follow up for the Depo-Provera as we discussed.

## 2015-01-30 ENCOUNTER — Telehealth: Payer: Self-pay | Admitting: *Deleted

## 2015-01-30 ENCOUNTER — Ambulatory Visit (INDEPENDENT_AMBULATORY_CARE_PROVIDER_SITE_OTHER): Payer: Managed Care, Other (non HMO) | Admitting: *Deleted

## 2015-01-30 DIAGNOSIS — Z3042 Encounter for surveillance of injectable contraceptive: Secondary | ICD-10-CM

## 2015-01-30 MED ORDER — MEDROXYPROGESTERONE ACETATE 150 MG/ML IM SUSP
150.0000 mg | Freq: Once | INTRAMUSCULAR | Status: AC
  Administered 2015-01-30: 150 mg via INTRAMUSCULAR

## 2015-01-30 MED ORDER — MEDROXYPROGESTERONE ACETATE 150 MG/ML IM SUSP: 150.0000 mg | Freq: Once | INTRAMUSCULAR | Status: DC

## 2015-01-30 NOTE — Telephone Encounter (Signed)
Pt called scheduled for depo-provera injection today, no Rx at pharmacy. Per note on 01/23/15 "Patients can represent over the next week to have her Depo-Provera. Currently not sexually active and the need to not have intercourse until Depo-Provera and use backup contraception for the first month reviewed."  Rx will be sent, pt will be aware, annual on 02/25/15

## 2015-02-25 ENCOUNTER — Encounter: Payer: Self-pay | Admitting: Women's Health

## 2015-02-25 ENCOUNTER — Ambulatory Visit (INDEPENDENT_AMBULATORY_CARE_PROVIDER_SITE_OTHER): Payer: Managed Care, Other (non HMO) | Admitting: Women's Health

## 2015-02-25 VITALS — BP 118/80 | Ht 65.0 in | Wt 210.0 lb

## 2015-02-25 DIAGNOSIS — A499 Bacterial infection, unspecified: Secondary | ICD-10-CM

## 2015-02-25 DIAGNOSIS — B9689 Other specified bacterial agents as the cause of diseases classified elsewhere: Secondary | ICD-10-CM

## 2015-02-25 DIAGNOSIS — Z23 Encounter for immunization: Secondary | ICD-10-CM

## 2015-02-25 DIAGNOSIS — N760 Acute vaginitis: Secondary | ICD-10-CM | POA: Diagnosis not present

## 2015-02-25 DIAGNOSIS — Z3042 Encounter for surveillance of injectable contraceptive: Secondary | ICD-10-CM | POA: Diagnosis not present

## 2015-02-25 DIAGNOSIS — Z01419 Encounter for gynecological examination (general) (routine) without abnormal findings: Secondary | ICD-10-CM | POA: Diagnosis not present

## 2015-02-25 LAB — WET PREP FOR TRICH, YEAST, CLUE
Trich, Wet Prep: NONE SEEN
Yeast Wet Prep HPF POC: NONE SEEN

## 2015-02-25 MED ORDER — MEDROXYPROGESTERONE ACETATE 150 MG/ML IM SUSP
150.0000 mg | Freq: Once | INTRAMUSCULAR | Status: DC
Start: 2015-02-25 — End: 2016-08-03

## 2015-02-25 MED ORDER — METRONIDAZOLE 0.75 % VA GEL: VAGINAL | Status: DC

## 2015-02-25 NOTE — Progress Notes (Signed)
Tanya Chapman May 14, 1990 [id number]    History:    Presents for annual exam.  Amenorrheic. Nexplanon placed 11/2012 removed 01/30/15 and Depo-Provera given. 2012 thyroidectomy endocrinologist manages. Negative STD screen 10/2014/ same partner. Normal Pap history. Received 2 gardasil's. Has gained 35 pounds in the past year and a half, relates to Nexplanon and her thyroid.  Past medical history, past surgical history, family history and social history were all reviewed and documented in the EPIC chart. Works at a [employer]. Daughters ages 43 and 21 both doing well.  ROS:  A ROS was performed and pertinent positives and negatives are included.  Exam:  Filed Vitals:   02/25/15 1027  BP: 118/80    General appearance:  Normal Thyroid:  Symmetrical, normal in size, without palpable masses or nodularity. Respiratory  Auscultation:  Clear without wheezing or rhonchi Cardiovascular  Auscultation:  Regular rate, without rubs, murmurs or gallops  Edema/varicosities:  Not grossly evident Abdominal  Soft,nontender, without masses, guarding or rebound.  Liver/spleen:  No organomegaly noted  Hernia:  None appreciated  Skin  Inspection:  Grossly normal   Breasts: Examined lying and sitting.     Right: Without masses, retractions, discharge or axillary adenopathy.     Left: Without masses, retractions, discharge or axillary adenopathy. Gentitourinary   Inguinal/mons:  Normal without inguinal adenopathy  External genitalia:  Normal  BUS/Urethra/Skene's glands:  Normal  Vagina:  Moderate amount of a white adherent discharge with odor, wet prep positive for moderate clue cells, TNTC bacteria  Cervix:  Normal  Uterus:   normal in size, shape and contour.  Midline and mobile  Adnexa/parametria:     Rt: Without masses or tenderness.   Lt: Without masses or tenderness.  Anus and perineum: Normal  Digital rectal exam: Normal sphincter tone without palpated masses or  tenderness  Assessment/Plan:  25 y.o. SBF G2 P2 for annual exam complaint of vaginal discharge with odor.  Amenorrheic on Depo-Provera Hypothyroid-endocrinologist manages labs and meds Bacteria vaginosis Obesity  Plan: Third gardasil given today. SBE's, regular exercise, calcium rich diet, continue to work on decreasing calories and increasing exercise for weight loss. MVI daily encouraged. Depo-Provera 150 IM every 12 weeks, prescription, proper use given and reviewed. MetroGel vaginal cream 1 applicator at bedtime 5, alcohol precautions reviewed. Instructed to call if no relief of discharge. UA, Pap normal 2015, new screening guidelines reviewed.  Harrington Challenger WHNP, 11:05 AM 02/25/2015

## 2015-02-25 NOTE — Addendum Note (Signed)
Addended by: Kem Parkinson on: 02/25/2015 11:51 AM   Modules accepted: Orders

## 2015-02-25 NOTE — Patient Instructions (Signed)
Health Maintenance, Female Adopting a healthy lifestyle and getting preventive care can go a long way to promote health and wellness. Talk with your health care provider about what schedule of regular examinations is right for you. This is a good chance for you to check in with your provider about disease prevention and staying healthy. In between checkups, there are plenty of things you can do on your own. Experts have done a lot of research about which lifestyle changes and preventive measures are most likely to keep you healthy. Ask your health care provider for more information. WEIGHT AND DIET  Eat a healthy diet  Be sure to include plenty of vegetables, fruits, low-fat dairy products, and lean protein.  Do not eat a lot of foods high in solid fats, added sugars, or salt.  Get regular exercise. This is one of the most important things you can do for your health.  Most adults should exercise for at least 150 minutes each week. The exercise should increase your heart rate and make you sweat (moderate-intensity exercise).  Most adults should also do strengthening exercises at least twice a week. This is in addition to the moderate-intensity exercise.  Maintain a healthy weight  Body mass index (BMI) is a measurement that can be used to identify possible weight problems. It estimates body fat based on height and weight. Your health care provider can help determine your BMI and help you achieve or maintain a healthy weight.  For females 20 years of age and older:   A BMI below 18.5 is considered underweight.  A BMI of 18.5 to 24.9 is normal.  A BMI of 25 to 29.9 is considered overweight.  A BMI of 30 and above is considered obese.  Watch levels of cholesterol and blood lipids  You should start having your blood tested for lipids and cholesterol at 25 years of age, then have this test every 5 years.  You may need to have your cholesterol levels checked more often if:  Your lipid  or cholesterol levels are high.  You are older than 25 years of age.  You are at high risk for heart disease.  CANCER SCREENING   Lung Cancer  Lung cancer screening is recommended for adults 55-80 years old who are at high risk for lung cancer because of a history of smoking.  A yearly low-dose CT scan of the lungs is recommended for people who:  Currently smoke.  Have quit within the past 15 years.  Have at least a 30-pack-year history of smoking. A pack year is smoking an average of one pack of cigarettes a day for 1 year.  Yearly screening should continue until it has been 15 years since you quit.  Yearly screening should stop if you develop a health problem that would prevent you from having lung cancer treatment.  Breast Cancer  Practice breast self-awareness. This means understanding how your breasts normally appear and feel.  It also means doing regular breast self-exams. Let your health care provider know about any changes, no matter how small.  If you are in your 20s or 30s, you should have a clinical breast exam (CBE) by a health care provider every 1-3 years as part of a regular health exam.  If you are 40 or older, have a CBE every year. Also consider having a breast X-ray (mammogram) every year.  If you have a family history of breast cancer, talk to your health care provider about genetic screening.  If you   are at high risk for breast cancer, talk to your health care provider about having an MRI and a mammogram every year.  Breast cancer gene (BRCA) assessment is recommended for women who have family members with BRCA-related cancers. BRCA-related cancers include:  Breast.  Ovarian.  Tubal.  Peritoneal cancers.  Results of the assessment will determine the need for genetic counseling and BRCA1 and BRCA2 testing. Cervical Cancer Your health care provider may recommend that you be screened regularly for cancer of the pelvic organs (ovaries, uterus, and  vagina). This screening involves a pelvic examination, including checking for microscopic changes to the surface of your cervix (Pap test). You may be encouraged to have this screening done every 3 years, beginning at age 21.  For women ages 30-65, health care providers may recommend pelvic exams and Pap testing every 3 years, or they may recommend the Pap and pelvic exam, combined with testing for human papilloma virus (HPV), every 5 years. Some types of HPV increase your risk of cervical cancer. Testing for HPV may also be done on women of any age with unclear Pap test results.  Other health care providers may not recommend any screening for nonpregnant women who are considered low risk for pelvic cancer and who do not have symptoms. Ask your health care provider if a screening pelvic exam is right for you.  If you have had past treatment for cervical cancer or a condition that could lead to cancer, you need Pap tests and screening for cancer for at least 20 years after your treatment. If Pap tests have been discontinued, your risk factors (such as having a new sexual partner) need to be reassessed to determine if screening should resume. Some women have medical problems that increase the chance of getting cervical cancer. In these cases, your health care provider may recommend more frequent screening and Pap tests. Colorectal Cancer  This type of cancer can be detected and often prevented.  Routine colorectal cancer screening usually begins at 25 years of age and continues through 25 years of age.  Your health care provider may recommend screening at an earlier age if you have risk factors for colon cancer.  Your health care provider may also recommend using home test kits to check for hidden blood in the stool.  A small camera at the end of a tube can be used to examine your colon directly (sigmoidoscopy or colonoscopy). This is done to check for the earliest forms of colorectal  cancer.  Routine screening usually begins at age 50.  Direct examination of the colon should be repeated every 5-10 years through 25 years of age. However, you may need to be screened more often if early forms of precancerous polyps or small growths are found. Skin Cancer  Check your skin from head to toe regularly.  Tell your health care provider about any new moles or changes in moles, especially if there is a change in a mole's shape or color.  Also tell your health care provider if you have a mole that is larger than the size of a pencil eraser.  Always use sunscreen. Apply sunscreen liberally and repeatedly throughout the day.  Protect yourself by wearing long sleeves, pants, a wide-brimmed hat, and sunglasses whenever you are outside. HEART DISEASE, DIABETES, AND HIGH BLOOD PRESSURE   High blood pressure causes heart disease and increases the risk of stroke. High blood pressure is more likely to develop in:  People who have blood pressure in the high end   of the normal range (130-139/85-89 mm Hg).  People who are overweight or obese.  People who are African American.  If you are 38-23 years of age, have your blood pressure checked every 3-5 years. If you are 61 years of age or older, have your blood pressure checked every year. You should have your blood pressure measured twice--once when you are at a hospital or clinic, and once when you are not at a hospital or clinic. Record the average of the two measurements. To check your blood pressure when you are not at a hospital or clinic, you can use:  An automated blood pressure machine at a pharmacy.  A home blood pressure monitor.  If you are between 45 years and 39 years old, ask your health care provider if you should take aspirin to prevent strokes.  Have regular diabetes screenings. This involves taking a blood sample to check your fasting blood sugar level.  If you are at a normal weight and have a low risk for diabetes,  have this test once every three years after 25 years of age.  If you are overweight and have a high risk for diabetes, consider being tested at a younger age or more often. PREVENTING INFECTION  Hepatitis B  If you have a higher risk for hepatitis B, you should be screened for this virus. You are considered at high risk for hepatitis B if:  You were born in a country where hepatitis B is common. Ask your health care provider which countries are considered high risk.  Your parents were born in a high-risk country, and you have not been immunized against hepatitis B (hepatitis B vaccine).  You have HIV or AIDS.  You use needles to inject street drugs.  You live with someone who has hepatitis B.  You have had sex with someone who has hepatitis B.  You get hemodialysis treatment.  You take certain medicines for conditions, including cancer, organ transplantation, and autoimmune conditions. Hepatitis C  Blood testing is recommended for:  Everyone born from [birth year] through 1965.  Anyone with known risk factors for hepatitis C. Sexually transmitted infections (STIs)  You should be screened for sexually transmitted infections (STIs) including gonorrhea and chlamydia if:  You are sexually active and are younger than 24 years of age.  You are older than 25 years of age and your health care provider tells you that you are at risk for this type of infection.  Your sexual activity has changed since you were last screened and you are at an increased risk for chlamydia or gonorrhea. Ask your health care provider if you are at risk.  If you do not have HIV, but are at risk, it may be recommended that you take a prescription medicine daily to prevent HIV infection. This is called pre-exposure prophylaxis (PrEP). You are considered at risk if:  You are sexually active and do not regularly use condoms or know the HIV status of your partner(s).  You take drugs by injection.  You are sexually  active with a partner who has HIV. Talk with your health care provider about whether you are at high risk of being infected with HIV. If you choose to begin PrEP, you should first be tested for HIV. You should then be tested every 3 months for as long as you are taking PrEP.  PREGNANCY   If you are premenopausal and you may become pregnant, ask your health care provider about preconception counseling.  If you may  become pregnant, take 400 to 800 micrograms (mcg) of folic acid every day.  If you want to prevent pregnancy, talk to your health care provider about birth control (contraception). OSTEOPOROSIS AND MENOPAUSE   Osteoporosis is a disease in which the bones lose minerals and strength with aging. This can result in serious bone fractures. Your risk for osteoporosis can be identified using a bone density scan.  If you are 65 years of age or older, or if you are at risk for osteoporosis and fractures, ask your health care provider if you should be screened.  Ask your health care provider whether you should take a calcium or vitamin D supplement to lower your risk for osteoporosis.  Menopause may have certain physical symptoms and risks.  Hormone replacement therapy may reduce some of these symptoms and risks. Talk to your health care provider about whether hormone replacement therapy is right for you.  HOME CARE INSTRUCTIONS   Schedule regular health, dental, and eye exams.  Stay current with your immunizations.   Do not use any tobacco products including cigarettes, chewing tobacco, or electronic cigarettes.  If you are pregnant, do not drink alcohol.  If you are breastfeeding, limit how much and how often you drink alcohol.  Limit alcohol intake to no more than 1 drink per day for nonpregnant women. One drink equals 12 ounces of beer, 5 ounces of wine, or 1 ounces of hard liquor.  Do not use street drugs.  Do not share needles.  Ask your health care provider for help if  you need support or information about quitting drugs.  Tell your health care provider if you often feel depressed.  Tell your health care provider if you have ever been abused or do not feel safe at home.   This information is not intended to replace advice given to you by your health care provider. Make sure you discuss any questions you have with your health care provider.   Document Released: 08/10/2010 Document Revised: 02/15/2014 Document Reviewed: 12/27/2012 Elsevier Interactive Patient Education 2016 [employer] Bacterial Vaginosis Bacterial vaginosis is a vaginal infection that occurs when the normal balance of bacteria in the vagina is disrupted. It results from an overgrowth of certain bacteria. This is the most common vaginal infection in women of childbearing age. Treatment is important to prevent complications, especially in pregnant women, as it can cause a premature delivery. CAUSES  Bacterial vaginosis is caused by an increase in harmful bacteria that are normally present in smaller amounts in the vagina. Several different kinds of bacteria can cause bacterial vaginosis. However, the reason that the condition develops is not fully understood. RISK FACTORS Certain activities or behaviors can put you at an increased risk of developing bacterial vaginosis, including:  Having a new sex partner or multiple sex partners.  Douching.  Using an intrauterine device (IUD) for contraception. Women do not get bacterial vaginosis from toilet seats, bedding, swimming pools, or contact with objects around them. SIGNS AND SYMPTOMS  Some women with bacterial vaginosis have no signs or symptoms. Common symptoms include:  Grey vaginal discharge.  A fishlike odor with discharge, especially after sexual intercourse.  Itching or burning of the vagina and vulva.  Burning or pain with urination. DIAGNOSIS  Your health care provider will take a medical history and examine the vagina for  signs of bacterial vaginosis. A sample of vaginal fluid may be taken. Your health care provider will look at this sample under a microscope to check for bacteria   and abnormal cells. A vaginal pH test may also be done.  TREATMENT  Bacterial vaginosis may be treated with antibiotic medicines. These may be given in the form of a pill or a vaginal cream. A second round of antibiotics may be prescribed if the condition comes back after treatment. Because bacterial vaginosis increases your risk for sexually transmitted diseases, getting treated can help reduce your risk for chlamydia, gonorrhea, HIV, and herpes. HOME CARE INSTRUCTIONS   Only take over-the-counter or prescription medicines as directed by your health care provider.  If antibiotic medicine was prescribed, take it as directed. Make sure you finish it even if you start to feel better.  Tell all sexual partners that you have a vaginal infection. They should see their health care provider and be treated if they have problems, such as a mild rash or itching.  During treatment, it is important that you follow these instructions:  Avoid sexual activity or use condoms correctly.  Do not douche.  Avoid alcohol as directed by your health care provider.  Avoid breastfeeding as directed by your health care provider. SEEK MEDICAL CARE IF:   Your symptoms are not improving after 3 days of treatment.  You have increased discharge or pain.  You have a fever. MAKE SURE YOU:   Understand these instructions.  Will watch your condition.  Will get help right away if you are not doing well or get worse. FOR MORE INFORMATION  Centers for Disease Control and Prevention, Division of STD Prevention: [employer] American Sexual Health Association (ASHA): www.ashastd.org    This information is not intended to replace advice given to you by your health care provider. Make sure you discuss any questions you have with your health care provider.    Document Released: 01/25/2005 Document Revised: 02/15/2014 Document Reviewed: 09/06/2012 Elsevier Interactive Patient Education [employer].

## 2015-02-26 LAB — URINALYSIS W MICROSCOPIC + REFLEX CULTURE
BILIRUBIN URINE: NEGATIVE
Bacteria, UA: NONE SEEN [HPF]
CASTS: NONE SEEN [LPF]
CRYSTALS: NONE SEEN [HPF]
Glucose, UA: NEGATIVE
Hgb urine dipstick: NEGATIVE
Ketones, ur: NEGATIVE
Nitrite: NEGATIVE
Protein, ur: NEGATIVE
RBC / HPF: NONE SEEN RBC/HPF (ref <=2)
SPECIFIC GRAVITY, URINE: 1.016 (ref 1.001–1.035)
Yeast: NONE SEEN [HPF]
pH: 5.5 (ref 5.0–8.0)

## 2015-02-27 LAB — URINE CULTURE

## 2015-05-06 ENCOUNTER — Ambulatory Visit (INDEPENDENT_AMBULATORY_CARE_PROVIDER_SITE_OTHER): Payer: Managed Care, Other (non HMO) | Admitting: *Deleted

## 2015-05-06 ENCOUNTER — Other Ambulatory Visit: Payer: Managed Care, Other (non HMO)

## 2015-05-06 DIAGNOSIS — Z3042 Encounter for surveillance of injectable contraceptive: Secondary | ICD-10-CM

## 2015-05-06 DIAGNOSIS — N912 Amenorrhea, unspecified: Secondary | ICD-10-CM

## 2015-05-06 LAB — PREGNANCY, URINE: Preg Test, Ur: NEGATIVE

## 2015-05-06 MED ORDER — MEDROXYPROGESTERONE ACETATE 150 MG/ML IM SUSP
150.0000 mg | Freq: Once | INTRAMUSCULAR | Status: AC
  Administered 2015-05-06: 150 mg via INTRAMUSCULAR

## 2015-05-27 ENCOUNTER — Encounter (HOSPITAL_COMMUNITY): Payer: Self-pay | Admitting: *Deleted

## 2015-05-27 ENCOUNTER — Emergency Department (HOSPITAL_COMMUNITY)
Admission: EM | Admit: 2015-05-27 | Discharge: 2015-05-27 | Disposition: A | Discharge status: Home / Self Care | Payer: Self-pay | Attending: Emergency Medicine | Admitting: Emergency Medicine

## 2015-05-27 ENCOUNTER — Emergency Department (HOSPITAL_COMMUNITY): Payer: Self-pay

## 2015-05-27 DIAGNOSIS — R5383 Other fatigue: Secondary | ICD-10-CM | POA: Insufficient documentation

## 2015-05-27 DIAGNOSIS — E0590 Thyrotoxicosis, unspecified without thyrotoxic crisis or storm: Secondary | ICD-10-CM | POA: Insufficient documentation

## 2015-05-27 DIAGNOSIS — R51 Headache: Secondary | ICD-10-CM | POA: Insufficient documentation

## 2015-05-27 DIAGNOSIS — Z8614 Personal history of Methicillin resistant Staphylococcus aureus infection: Secondary | ICD-10-CM | POA: Insufficient documentation

## 2015-05-27 DIAGNOSIS — H539 Unspecified visual disturbance: Secondary | ICD-10-CM | POA: Insufficient documentation

## 2015-05-27 DIAGNOSIS — R42 Dizziness and giddiness: Secondary | ICD-10-CM | POA: Insufficient documentation

## 2015-05-27 DIAGNOSIS — R5381 Other malaise: Secondary | ICD-10-CM | POA: Insufficient documentation

## 2015-05-27 DIAGNOSIS — R079 Chest pain, unspecified: Secondary | ICD-10-CM | POA: Insufficient documentation

## 2015-05-27 DIAGNOSIS — R519 Headache, unspecified: Secondary | ICD-10-CM

## 2015-05-27 DIAGNOSIS — R0602 Shortness of breath: Secondary | ICD-10-CM | POA: Insufficient documentation

## 2015-05-27 DIAGNOSIS — Z79899 Other long term (current) drug therapy: Secondary | ICD-10-CM | POA: Insufficient documentation

## 2015-05-27 LAB — CBC WITH DIFFERENTIAL/PLATELET
BASOS ABS: 0.1 10*3/uL (ref 0.0–0.1)
BASOS PCT: 1 %
Eosinophils Absolute: 0.2 10*3/uL (ref 0.0–0.7)
Eosinophils Relative: 3 %
HEMATOCRIT: 34.4 % — AB (ref 36.0–46.0)
HEMOGLOBIN: 11.6 g/dL — AB (ref 12.0–15.0)
Lymphocytes Relative: 36 %
Lymphs Abs: 2 10*3/uL (ref 0.7–4.0)
MCH: 27.4 pg (ref 26.0–34.0)
MCHC: 33.7 g/dL (ref 30.0–36.0)
MCV: 81.1 fL (ref 78.0–100.0)
MONOS PCT: 6 %
Monocytes Absolute: 0.3 10*3/uL (ref 0.1–1.0)
NEUTROS ABS: 3.1 10*3/uL (ref 1.7–7.7)
NEUTROS PCT: 54 %
Platelets: 313 10*3/uL (ref 150–400)
RBC: 4.24 MIL/uL (ref 3.87–5.11)
RDW: 14.3 % (ref 11.5–15.5)
WBC: 5.6 10*3/uL (ref 4.0–10.5)

## 2015-05-27 LAB — BASIC METABOLIC PANEL
ANION GAP: 8 (ref 5–15)
BUN: 8 mg/dL (ref 6–20)
CHLORIDE: 107 mmol/L (ref 101–111)
CO2: 24 mmol/L (ref 22–32)
Calcium: 9.1 mg/dL (ref 8.9–10.3)
Creatinine, Ser: 1.02 mg/dL — ABNORMAL HIGH (ref 0.44–1.00)
GFR calc non Af Amer: >60 mL/min (ref >60)
Glucose, Bld: 90 mg/dL (ref 65–99)
POTASSIUM: 3.5 mmol/L (ref 3.5–5.1)
Sodium: 139 mmol/L (ref 135–145)

## 2015-05-27 NOTE — ED Notes (Signed)
Patient transported to CT 

## 2015-05-27 NOTE — ED Provider Notes (Signed)
CSN: [id number]     Arrival date & time 05/27/15  1405 History   First MD Initiated Contact with Patient 05/27/15 1748     Chief Complaint  Patient presents with  . Headache     (Consider location/radiation/quality/duration/timing/severity/associated sxs/prior Treatment) HPI   Patient is a 25 year old female with past medical history of thyroid disease (status post thyroidectomy) who presents the ED with complaint of headache. Patient reports she has been having intermittent headaches for the past 1-2 months. She reports having pressure/throbbing pain to multiple areas of her head that lasts for approximately one hour and then resolved spontaneously. She notes the headache changes location sporadically. She denies any aggravating or alleviating factors. Endorses associated lightheadedness, visual aura ("seeing stars"), chest tightness, SOB, fatigue and generalized malaise. Pt notes she has been taking Tylenol at home with intermittent relief. Denies being evaluated by anyone for there sxs. Pt denies fever, neck stiffness, photophobia, abdominal pain, N/V, urinary symptoms, numbness, tingling, weakness, seizures, syncope.    Past Medical History  Diagnosis Date  . Thyroid disease     hyperthyroid   . Weight loss, unintentional   . Trouble swallowing   . Visual disturbance     eyes bulging, blurry, and hurting   . Generalized headaches     migraines and generalized   . Fatigue   . Hyperthyroidism   . MRSA carrier    Past Surgical History  Procedure Laterality Date  . Hernia repair  1996  . Cesarean section  11/22/08  . Total thyroidectomy  11/26/10  . Nexplanon  01/23/2015    Nexplanon removed   Family History  Problem Relation Age of Onset  . Migraines Mother   . Migraines Brother   . Diabetes Maternal Grandmother   . Hypertension Maternal Grandmother   . Hypertension Maternal Grandfather   . Stroke Maternal Grandfather   . Breast cancer Paternal Grandmother   . Diabetes  Paternal Grandmother   . Thyroid disease Paternal Grandmother   . Hypertension Paternal Grandfather    Social History  Substance Use Topics  . Smoking status: Never Smoker   . Smokeless tobacco: Never Used  . Alcohol Use: 0.0 oz/week    0 Standard drinks or equivalent per week   OB History    Gravida Para Term Preterm AB TAB SAB Ectopic Multiple Living   5 0  0 3 3 0 0 0 2     Review of Systems  Constitutional: Positive for fatigue.  Eyes: Positive for visual disturbance.  Respiratory: Positive for chest tightness and shortness of breath.   Musculoskeletal: Positive for myalgias.  Neurological: Positive for light-headedness and headaches.  All other systems reviewed and are negative.     Allergies  Doxycycline and Provera  Home Medications   Prior to Admission medications   Medication Sig Start Date End Date Taking? Authorizing Provider  levothyroxine (SYNTHROID) 150 MCG tablet Take 1 tablet (150 mcg total) by mouth daily before breakfast. 10/27/12  Yes Harrington Challenger, NP  medroxyPROGESTERone (DEPO-PROVERA) 150 MG/ML injection Inject 1 mL (150 mg total) into the muscle once. 02/25/15   Harrington Challenger, NP  metroNIDAZOLE (METROGEL VAGINAL) 0.75 % vaginal gel 1 applicator per vagina at HS x 5 02/25/15   Harrington Challenger, NP   BP 93/64 mmHg  Pulse 63  Temp(Src) 99.5 F (37.5 C) (Oral)  Resp 16  Wt 97.523 kg  SpO2 100%  LMP 01/13/2015 Physical Exam  Constitutional: She is oriented to person, place, and time.  She appears well-developed and well-nourished. No distress.  HENT:  Head: Normocephalic and atraumatic.  Right Ear: Tympanic membrane normal.  Left Ear: Tympanic membrane normal.  Nose: Nose normal.  Mouth/Throat: Uvula is midline, oropharynx is clear and moist and mucous membranes are normal. No oropharyngeal exudate, posterior oropharyngeal edema, posterior oropharyngeal erythema or tonsillar abscesses.  Eyes: Conjunctivae and EOM are normal. Pupils are equal, round,  and reactive to light. Right eye exhibits no discharge. Left eye exhibits no discharge. No scleral icterus.  Neck: Normal range of motion. Neck supple.  Cardiovascular: Normal rate, regular rhythm, normal heart sounds and intact distal pulses.   Pulmonary/Chest: Effort normal and breath sounds normal. No respiratory distress. She has no wheezes. She has no rales. She exhibits no tenderness.  Abdominal: Soft. Bowel sounds are normal. She exhibits no distension and no mass. There is no tenderness. There is no rebound and no guarding.  Musculoskeletal: Normal range of motion. She exhibits no edema.  Lymphadenopathy:    She has no cervical adenopathy.  Neurological: She is alert and oriented to person, place, and time. She has normal strength. No cranial nerve deficit or sensory deficit. She displays a negative Romberg sign. Coordination normal.  Skin: Skin is warm and dry. She is not diaphoretic.  Nursing note and vitals reviewed.   ED Course  Procedures (including critical care time) Labs Review Labs Reviewed  CBC WITH DIFFERENTIAL/PLATELET - Abnormal; Notable for the following:    Hemoglobin 11.6 (*)    HCT 34.4 (*)    All other components within normal limits  BASIC METABOLIC PANEL - Abnormal; Notable for the following:    Creatinine, Ser 1.02 (*)    All other components within normal limits    Imaging Review Ct Head Wo Contrast  05/27/2015  CLINICAL DATA:  Headache for 1 month. Initial encounter. No known injury. EXAM: CT HEAD WITHOUT CONTRAST TECHNIQUE: Contiguous axial images were obtained from the base of the skull through the vertex without intravenous contrast. COMPARISON:  None. FINDINGS: There is no evidence of acute intracranial abnormality including hemorrhage, infarct, mass lesion, mass effect, midline shift or abnormal extra-axial fluid collection. No hydrocephalus or pneumocephalus. Imaged paranasal sinuses and mastoid air cells are clear. The calvarium is intact. IMPRESSION:  Negative head CT. Electronically Signed   By: Drusilla Kannerhomas  Dalessio M.D.   On: 05/27/2015 19:46   I have personally reviewed and evaluated these images and lab results as part of my medical decision-making.   EKG Interpretation None      MDM   Final diagnoses:  Nonintractable headache, unspecified chronicity pattern, unspecified headache type   Patient presents with intermittent headaches with associated chest tightness, fatigue, shortness of breath, generalized myalgias, visual changes, lightheadedness. Pt reports the location of her HA changes with the episodes, denies any aggravating/alleviating factors. Denies fever. Patient reports since arrival to the ED she has not had any symptoms. VSS. Exam unremarkable, no neuro deficits. Labs unremarkable. CT head ordered for further evaluation due to unusual presentation of headaches and pt not having any other workup/evaluation for her headaches. CT head negative. On reevaluation patient denies having any pain or complaints at this time. Discussed results, symptomatic tx and plan for discharge with patient. Advised patient to follow up with her PCP.  Evaluation does not show pathology requring ongoing emergent intervention or admission. Pt is hemodynamically stable and mentating appropriately. All questions answered. Return precautions discussed and outpatient follow up given.     Satira Sarkicole Elizabeth ZionsvilleNadeau, [place] Jersey[place]-C 05/27/15 2013  Gerhard Munch, MD 05/27/15 2330

## 2015-05-27 NOTE — ED Notes (Signed)
Pt has had headaches on and off for a month with increased fatigue and pain in joints.

## 2015-05-27 NOTE — Discharge Instructions (Signed)
I recommend taking 600mg  Ibuprofen four times daily as needed for headache. Follow up with your primary care provider in the next week. Please return to the Emergency Department if symptoms worsen or new onset of fever, neck stiffness, visual changes, light sensitivity, abdominal pain, vomiting, urinary symptoms, numbness, tingling, weakness, seizures, syncope.

## 2015-06-18 ENCOUNTER — Ambulatory Visit (INDEPENDENT_AMBULATORY_CARE_PROVIDER_SITE_OTHER): Payer: Managed Care, Other (non HMO) | Admitting: Gynecology

## 2015-06-18 ENCOUNTER — Encounter: Payer: Self-pay | Admitting: Gynecology

## 2015-06-18 VITALS — BP 116/74

## 2015-06-18 DIAGNOSIS — R102 Pelvic and perineal pain: Secondary | ICD-10-CM | POA: Diagnosis not present

## 2015-06-18 DIAGNOSIS — N912 Amenorrhea, unspecified: Secondary | ICD-10-CM

## 2015-06-18 LAB — URINALYSIS W MICROSCOPIC + REFLEX CULTURE
BILIRUBIN URINE: NEGATIVE
Casts: NONE SEEN [LPF]
Crystals: NONE SEEN [HPF]
GLUCOSE, UA: NEGATIVE
Hgb urine dipstick: NEGATIVE
Ketones, ur: NEGATIVE
NITRITE: NEGATIVE
PH: 7 (ref 5.0–8.0)
Protein, ur: NEGATIVE
SPECIFIC GRAVITY, URINE: 1.015 (ref 1.001–1.035)
Yeast: NONE SEEN [HPF]

## 2015-06-18 NOTE — Progress Notes (Signed)
    Tanya Chapman 06/23/1990 [id number]        25 y.o.  [id number] Presents complaining of feelings in her abdomen as if the baby was moving. Is on Depo-Provera and not having menses with LMP 01/13/2015. Not really having pain but just feeling like something is moving. Does have an issue with constipation where she will go up to 4 days without bowel movements. No nausea vomiting or diarrhea. A little bit of urinary frequency but no dysuria urgency low back pain fever or chills.  Past medical history,surgical history, problem list, medications, allergies, family history and social history were all reviewed and documented in the EPIC chart.  Directed ROS with pertinent positives and negatives documented in the history of present illness/assessment and plan.  Exam: Kennon PortelaKim Gardner assistant Filed Vitals:   06/18/15 1436  BP: 116/74   General appearance:  Normal Spine straight without CVA tenderness Abdomen soft nontender without masses guarding rebound Pelvic external BUS vagina normal. Cervix normal. Uterus grossly normal size midline mobile nontender. Adnexa without masses or tenderness.  Assessment/Plan:  25 y.o. [id number] with history as above. Check qualitative hCG and urinalysis. Given the unusual nature of her complaint I'm also going to schedule a baseline ultrasound to rule out ovarian process. Assuming all negative then I suspect it is related to her GI issues and constipation.  Will start on stool softener and see if we cannot get her to have more frequent bowel movements. Patient will follow up for the ultrasound and we will go from there.    Dara LordsFONTAINE,TIMOTHY P MD, 2:47 PM 06/18/2015

## 2015-06-18 NOTE — Patient Instructions (Signed)
Follow up for ultrasound as scheduled 

## 2015-06-19 LAB — HCG, SERUM, QUALITATIVE: Preg, Serum: NEGATIVE

## 2015-06-20 LAB — URINE CULTURE: Colony Count: 50000

## 2015-06-22 ENCOUNTER — Encounter (HOSPITAL_COMMUNITY): Payer: Self-pay | Admitting: *Deleted

## 2015-06-22 ENCOUNTER — Emergency Department (HOSPITAL_COMMUNITY)
Admission: EM | Admit: 2015-06-22 | Discharge: 2015-06-22 | Disposition: A | Discharge status: Home / Self Care | Payer: Managed Care, Other (non HMO) | Attending: Dermatology | Admitting: Dermatology

## 2015-06-22 DIAGNOSIS — Z5321 Procedure and treatment not carried out due to patient leaving prior to being seen by health care provider: Secondary | ICD-10-CM | POA: Diagnosis not present

## 2015-06-22 DIAGNOSIS — Y999 Unspecified external cause status: Secondary | ICD-10-CM | POA: Diagnosis not present

## 2015-06-22 DIAGNOSIS — Y939 Activity, unspecified: Secondary | ICD-10-CM | POA: Diagnosis not present

## 2015-06-22 DIAGNOSIS — Y929 Unspecified place or not applicable: Secondary | ICD-10-CM | POA: Diagnosis not present

## 2015-06-22 DIAGNOSIS — S60414A Abrasion of right ring finger, initial encounter: Secondary | ICD-10-CM | POA: Diagnosis present

## 2015-06-22 DIAGNOSIS — W4904XA Ring or other jewelry causing external constriction, initial encounter: Secondary | ICD-10-CM | POA: Insufficient documentation

## 2015-06-22 NOTE — ED Notes (Signed)
Pt states that she needs to have the ring removed from her rt ring finger; pt with swelling to the area and small abrasion
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
## 2015-06-25 ENCOUNTER — Encounter: Payer: Self-pay | Admitting: Gynecology

## 2015-06-25 ENCOUNTER — Ambulatory Visit (INDEPENDENT_AMBULATORY_CARE_PROVIDER_SITE_OTHER): Payer: Managed Care, Other (non HMO) | Admitting: Gynecology

## 2015-06-25 ENCOUNTER — Ambulatory Visit (INDEPENDENT_AMBULATORY_CARE_PROVIDER_SITE_OTHER): Payer: Managed Care, Other (non HMO)

## 2015-06-25 VITALS — BP 118/74

## 2015-06-25 DIAGNOSIS — R102 Pelvic and perineal pain: Secondary | ICD-10-CM

## 2015-06-25 NOTE — Progress Notes (Signed)
    Tanya Chapman 06/15/1990 [id number]        25 y.o.  [id number] presents for ultrasound. Was evaluated 06/18/2015 with history of feeling like something is moving in her abdomen. Is on Depo-Provera with LMP 01/2015. HCG and urinalysis were negative. Does note history of constipation which she will go up to 4 days without having a bowel movement.  Past medical history,surgical history, problem list, medications, allergies, family history and social history were all reviewed and documented in the EPIC chart.  Directed ROS with pertinent positives and negatives documented in the history of present illness/assessment and plan.  Exam: Filed Vitals:   06/25/15 0929  BP: 118/74   General appearance:  Normal  Ultrasound shows uterus normal size and echotexture. Endometrial echo 5.8 mm. Right ovary normal. Left ovary with dominant echo-free a vascular follicle 23 mm mean. Cul-de-sac negative.  Assessment/Plan:  25 y.o. [id number] with history as above. Ultrasound is negative. Reviewed with patient I suspect her feelings are due to constipation and intestinal. Recommended she start on a bulk forming stool supplements such as FiberCon or Metamucil. Try to have regular daily bowel movements. See if this doesn't eliminate her feeling. If symptoms persist she will call and I will refer to gastroenterologist. Otherwise assuming she does well she'll follow up for annual exam in January 2018 when due.    Dara LordsFONTAINE,Doniqua Saxby P MD, 9:47 AM 06/25/2015

## 2015-06-25 NOTE — Patient Instructions (Signed)
Call if your abdominal discomfort continues.

## 2015-09-24 ENCOUNTER — Ambulatory Visit (HOSPITAL_COMMUNITY)
Admission: EM | Admit: 2015-09-24 | Discharge: 2015-09-24 | Disposition: A | Discharge status: Home / Self Care | Payer: Managed Care, Other (non HMO) | Attending: Family Medicine | Admitting: Family Medicine

## 2015-09-24 ENCOUNTER — Encounter (HOSPITAL_COMMUNITY): Payer: Self-pay | Admitting: *Deleted

## 2015-09-24 DIAGNOSIS — L0291 Cutaneous abscess, unspecified: Secondary | ICD-10-CM

## 2015-09-24 DIAGNOSIS — L0390 Cellulitis, unspecified: Secondary | ICD-10-CM | POA: Diagnosis not present

## 2015-09-24 MED ORDER — CEPHALEXIN 500 MG PO CAPS: 500.0000 mg | ORAL_CAPSULE | Freq: Four times a day (QID) | ORAL | 0 refills | Status: DC

## 2015-09-24 MED ORDER — LIDOCAINE-EPINEPHRINE (PF) 2 %-1:200000 IJ SOLN
INTRAMUSCULAR | Status: AC
  Filled 2015-09-24: qty 20

## 2015-09-24 NOTE — Discharge Instructions (Signed)
You need to take keflex 4 times per day for the next 7 days to treat the infection. Do not get the incision site wet for 24 - 48 hours.

## 2015-09-24 NOTE — ED Triage Notes (Signed)
Pt  Has  A  bil on  Buttock   X     2  Months    That  Has  Been  Getting  Worse   Over  Last few  Days         Pt  Reports  Symptoms  Not  releived  By  Warm   Compresses

## 2015-09-24 NOTE — ED Provider Notes (Signed)
[facility]    CSN: [id number] Arrival date & time: 09/24/15  1414  First Provider Contact:  First MD Initiated Contact with Patient 09/24/15 1541     History   Chief Complaint Chief Complaint  Patient presents with  . Abscess   HPI Tanya Chapman is a 25 y.o. female presenting for a boil on her buttock.   She reports noticing a small bump on her buttock near the anus for about 2 months that became acutely much more painful in the past few days causing constant pain worse with sitting. The area is red. Yesterday a friend used a safety pin to incise the bump with return of blood and pus but the pain remains so she presented for care. No fevers. No immunosuppression, nonsmoker, no history of recurrent infections.   Past Medical History:  Diagnosis Date  . Fatigue   . Generalized headaches    migraines and generalized   . Hyperthyroidism   . MRSA carrier   . Thyroid disease    hyperthyroid   . Trouble swallowing   . Visual disturbance    eyes bulging, blurry, and hurting   . Weight loss, unintentional     Patient Active Problem List   Diagnosis Date Noted  . Dysmenorrhea 10/22/2011  . Menorrhagia 10/22/2011  . Thyroid goiter 11/03/2010  . Grave's disease 11/03/2010    Past Surgical History:  Procedure Laterality Date  . CESAREAN SECTION  11/22/08  . HERNIA REPAIR  1996  . nexplanon  01/23/2015   Nexplanon removed  . TOTAL THYROIDECTOMY  11/26/10    OB History    Gravida Para Term Preterm AB Living   5 0   0 3 2   SAB TAB Ectopic Multiple Live Births   0 3 0 0         Home Medications    Prior to Admission medications   Medication Sig Start Date End Date Taking? Authorizing Provider  cephALEXin (KEFLEX) 500 MG capsule Take 1 capsule (500 mg total) by mouth 4 (four) times daily. 09/24/15   Tyrone Nineyan B Toron Bowring, MD  levothyroxine (SYNTHROID) 150 MCG tablet Take 1 tablet (150 mcg total) by mouth daily before breakfast. Patient taking differently: Take  100 mcg by mouth daily before breakfast.  10/27/12   Harrington ChallengerNancy J Young, NP  medroxyPROGESTERone (DEPO-PROVERA) 150 MG/ML injection Inject 1 mL (150 mg total) into the muscle once. 02/25/15   Harrington ChallengerNancy J Young, NP    Family History Family History  Problem Relation Age of Onset  . Migraines Mother   . Migraines Brother   . Diabetes Maternal Grandmother   . Hypertension Maternal Grandmother   . Hypertension Maternal Grandfather   . Stroke Maternal Grandfather   . Breast cancer Paternal Grandmother   . Diabetes Paternal Grandmother   . Thyroid disease Paternal Grandmother   . Hypertension Paternal Grandfather     Social History Social History  Substance Use Topics  . Smoking status: Never Smoker  . Smokeless tobacco: Never Used  . Alcohol use 0.0 oz/week     Allergies   Doxycycline and Provera [medroxyprogesterone acetate]   Review of Systems Review of Systems As above  Physical Exam Triage Vital Signs ED Triage Vitals [09/24/15 1542]  Enc Vitals Group     BP 117/83     Pulse Rate 80     Resp 18     Temp 98.2 F (36.8 C)     Temp Source Oral     SpO2  99 %     Weight      Height      Head Circumference      Peak Flow      Pain Score      Pain Loc      Pain Edu?      Excl. in GC?    No data found.   Updated Vital Signs BP 117/83 (BP Location: Left Arm)   Pulse 80   Temp 98.2 F (36.8 C) (Oral)   Resp 18   SpO2 99%    Physical Exam  Constitutional: She is oriented to person, place, and time. She appears well-developed and well-nourished. No distress.  Eyes: Conjunctivae are normal.  Neck: Normal range of motion. Neck supple.  Cardiovascular: Normal rate and regular rhythm.   Pulmonary/Chest: Effort normal and breath sounds normal. No respiratory distress.  Abdominal: Soft. Bowel sounds are normal. She exhibits no distension. There is no tenderness.  Neurological: She is alert and oriented to person, place, and time.  Skin: Skin is warm and dry. Capillary  refill takes less than 2 seconds.  Vitals reviewed.    UC Treatments / Results  Labs (all labs ordered are listed, but only abnormal results are displayed) Labs Reviewed - No data to display  EKG  EKG Interpretation None      Radiology No results found.  Procedures .Marland Kitchen.Incision and Drainage Date/Time: 09/24/2015 4:12 PM Performed by: Tyrone NineGRUNZ, Remmie Bembenek B Authorized by: Hazeline JunkerGRUNZ, Denae Zulueta B   Consent:    Consent obtained:  Verbal   Consent given by:  Patient   Risks discussed:  Bleeding, incomplete drainage, pain and infection Location:    Type:  Abscess   Size:  5mm   Location:  Lower extremity   Lower extremity location:  Leg   Leg location:  L upper leg Pre-procedure details:    Skin preparation:  Antiseptic wash Anesthesia (see MAR for exact dosages):    Anesthesia method:  Local infiltration   Local anesthetic:  Lidocaine 2% WITH epi Procedure type:    Complexity:  Simple Procedure details:    Incision types:  Stab incision   Incision depth:  Dermal   Scalpel blade:  11   Drainage:  Bloody and purulent   Drainage amount:  Scant   Wound treatment:  Wound left open   Packing materials:  None Post-procedure details:    Patient tolerance of procedure:  Tolerated well, no immediate complications   (including critical care time)  Medications Ordered in UC Medications - No data to display  Initial Impression / Assessment and Plan / UC Course  I have reviewed the triage vital signs and the nursing notes.  Pertinent labs & imaging results that were available during my care of the patient were reviewed by me and considered in my medical decision making (see chart for details).  Final Clinical Impressions(s) / UC Diagnoses   Final diagnoses:  Abscess and cellulitis   25 y.o. female with abscess and cellulitis s/p I&D. Still with significant induration and slowly growing 2nd lesion, so will Rx keflex x7 days. Return precautions reviewed.   New Prescriptions New  Prescriptions   CEPHALEXIN (KEFLEX) 500 MG CAPSULE    Take 1 capsule (500 mg total) by mouth 4 (four) times daily.     Tyrone Nineyan B Vickii Volland, MD 09/24/15 [phone]

## 2015-09-24 NOTE — ED Notes (Signed)
At bedside for dr Jarvis Newcomergrunz I/d of  Abscess to left buttocks.  No culture obtained.

## 2015-12-29 ENCOUNTER — Telehealth: Payer: Self-pay | Admitting: Family Medicine

## 2015-12-29 NOTE — Telephone Encounter (Signed)
Ret call to pt as she wanted to become a new pt.  Bal in collection.  Advised once that is taken care of, we would take her as a new pt with [employer].

## 2016-04-13 ENCOUNTER — Encounter (HOSPITAL_COMMUNITY): Payer: Self-pay | Admitting: Emergency Medicine

## 2016-04-13 ENCOUNTER — Emergency Department (HOSPITAL_COMMUNITY)
Admission: EM | Admit: 2016-04-13 | Discharge: 2016-04-13 | Disposition: A | Discharge status: Home / Self Care | Payer: Managed Care, Other (non HMO) | Attending: Emergency Medicine | Admitting: Emergency Medicine

## 2016-04-13 DIAGNOSIS — J0191 Acute recurrent sinusitis, unspecified: Secondary | ICD-10-CM

## 2016-04-13 DIAGNOSIS — J0101 Acute recurrent maxillary sinusitis: Secondary | ICD-10-CM | POA: Insufficient documentation

## 2016-04-13 DIAGNOSIS — J0111 Acute recurrent frontal sinusitis: Secondary | ICD-10-CM | POA: Insufficient documentation

## 2016-04-13 MED ORDER — OXYMETAZOLINE HCL 0.05 % NA SOLN: 1.0000 | Freq: Two times a day (BID) | NASAL | 0 refills | Status: DC

## 2016-04-13 MED ORDER — AMOXICILLIN-POT CLAVULANATE 875-125 MG PO TABS: 1.0000 | ORAL_TABLET | Freq: Two times a day (BID) | ORAL | 0 refills | Status: DC

## 2016-04-13 MED ORDER — BENZONATATE 100 MG PO CAPS: 200.0000 mg | ORAL_CAPSULE | Freq: Two times a day (BID) | ORAL | 0 refills | Status: DC | PRN

## 2016-04-13 NOTE — ED Triage Notes (Signed)
Pt reports cold SX x 2 weeks , productive cough , chest pain only when coughing. Nasal congestion. sts was on teraflu with no relief.

## 2016-04-13 NOTE — Discharge Instructions (Signed)
Take your antibiotic as prescribed until completed. Continue taking her other medications as needed for nasal congestion and cough. Continue drinking fluids at home to remain hydrated. I also recommend the performing the nasal rinses described below 2-3 times daily to help with her nasal congestion. Please follow up with a primary care provider from the Resource Guide provided below in 4-5 days if your symptoms have not improved. Please return to the Emergency Department if symptoms worsen or new onset of fever, headache, neck stiffness, facial/neck swelling, difficulty breathing, unable to swallow resulting in drooling, chest pain, vomiting, unable to keep fluids down.

## 2016-04-13 NOTE — ED Provider Notes (Signed)
[facility]-EMERGENCY DEPT Provider Note    By signing my name below, I, Earmon PhoenixJennifer Waddell, attest that this documentation has been prepared under the direction and in the presence of Melburn HakeNicole Tilmon Wisehart, PA-C. Electronically Signed: Earmon PhoenixJennifer Waddell, ED Scribe. 04/13/16. 12:54 PM.   History   Chief Complaint Chief Complaint  Patient presents with  . Cough   The history is provided by the patient and medical records. No language interpreter was used.    Tanya Chapman is a 26 y.o. female who presents to the Emergency Department complaining of cold like symptoms for the past two weeks. She reports associated productive cough of mucous, rhinorrhea, HA, sinus pain and pressure, scratchy throat and body aches. She states he symptoms began resolving but have now returned and worsened. She has taken Theraflu for her symptoms with minimal relief. She reports sick contacts stating both her daughters have been sick. She denies modifying factors. She denies fever, chills, abdominal pain, nausea, vomiting, SOB, wheezing, dysuria, hematuria, hemoptysis, rash. She reports PMHx of hyperthyroidism.   Past Medical History:  Diagnosis Date  . Fatigue   . Generalized headaches    migraines and generalized   . Hyperthyroidism   . MRSA carrier   . Thyroid disease    hyperthyroid   . Trouble swallowing   . Visual disturbance    eyes bulging, blurry, and hurting   . Weight loss, unintentional     Patient Active Problem List   Diagnosis Date Noted  . Dysmenorrhea 10/22/2011  . Menorrhagia 10/22/2011  . Thyroid goiter 11/03/2010  . Grave's disease 11/03/2010    Past Surgical History:  Procedure Laterality Date  . CESAREAN SECTION  11/22/08  . HERNIA REPAIR  1996  . nexplanon  01/23/2015   Nexplanon removed  . TOTAL THYROIDECTOMY  11/26/10    OB History    Gravida Para Term Preterm AB Living   5 0   0 3 2   SAB TAB Ectopic Multiple Live Births   0 3 0 0         Home Medications    Prior  to Admission medications   Medication Sig Start Date End Date Taking? Authorizing Provider  amoxicillin-clavulanate (AUGMENTIN) 875-125 MG tablet Take 1 tablet by mouth every 12 (twelve) hours. 04/13/16   Barrett HenleNicole Elizabeth Tremell Reimers, PA-C  benzonatate (TESSALON) 100 MG capsule Take 2 capsules (200 mg total) by mouth 2 (two) times daily as needed for cough. 04/13/16   Barrett HenleNicole Elizabeth Remigio Mcmillon, PA-C  cephALEXin (KEFLEX) 500 MG capsule Take 1 capsule (500 mg total) by mouth 4 (four) times daily. 09/24/15   Tyrone Nineyan B Grunz, MD  levothyroxine (SYNTHROID) 150 MCG tablet Take 1 tablet (150 mcg total) by mouth daily before breakfast. Patient taking differently: Take 100 mcg by mouth daily before breakfast.  10/27/12   Harrington ChallengerNancy J Young, NP  medroxyPROGESTERone (DEPO-PROVERA) 150 MG/ML injection Inject 1 mL (150 mg total) into the muscle once. 02/25/15   Harrington ChallengerNancy J Young, NP  oxymetazoline (AFRIN NASAL SPRAY) 0.05 % nasal spray Place 1 spray into both nostrils 2 (two) times daily. Spray once into each nostril twice daily for up to the next 3 days. Do not use for more than 3 days to prevent rebound rhinorrhea. 04/13/16   Barrett HenleNicole Elizabeth Clemens Lachman, PA-C    Family History Family History  Problem Relation Age of Onset  . Migraines Mother   . Migraines Brother   . Diabetes Maternal Grandmother   . Hypertension Maternal Grandmother   . Hypertension Maternal  Grandfather   . Stroke Maternal Grandfather   . Breast cancer Paternal Grandmother   . Diabetes Paternal Grandmother   . Thyroid disease Paternal Grandmother   . Hypertension Paternal Grandfather     Social History Social History  Substance Use Topics  . Smoking status: Never Smoker  . Smokeless tobacco: Never Used  . Alcohol use 0.0 oz/week     Allergies   Doxycycline and Provera [medroxyprogesterone acetate]   Review of Systems Review of Systems  Constitutional: Negative for chills and fever.  HENT: Positive for congestion, rhinorrhea, sinus pain, sinus  pressure and sore throat.   Respiratory: Positive for cough. Negative for shortness of breath and wheezing.   Gastrointestinal: Negative for abdominal pain, nausea and vomiting.  Genitourinary: Negative for dysuria and hematuria.  Musculoskeletal: Positive for myalgias.  Skin: Negative for rash.  Neurological: Positive for headaches.     Physical Exam Updated Vital Signs BP 115/80 (BP Location: Left Arm)   Pulse 87   Temp 98.8 F (37.1 C) (Oral)   Resp 16   LMP 04/12/2016   SpO2 100%   Physical Exam  Constitutional: She is oriented to person, place, and time. She appears well-developed and well-nourished.  HENT:  Head: Normocephalic and atraumatic.  Right Ear: Hearing, tympanic membrane, external ear and ear canal normal.  Left Ear: Hearing, tympanic membrane, external ear and ear canal normal.  Nose: Rhinorrhea present. Right sinus exhibits maxillary sinus tenderness and frontal sinus tenderness. Left sinus exhibits maxillary sinus tenderness and frontal sinus tenderness.  Mouth/Throat: Uvula is midline, oropharynx is clear and moist and mucous membranes are normal.  No trismus, drooling, facial/neck swelling or stridor on exam. No muffled voice. Floor of mouth soft.    Eyes: Conjunctivae and EOM are normal. Right eye exhibits no discharge. Left eye exhibits no discharge. No scleral icterus.  Neck: Normal range of motion. Neck supple.  Cardiovascular: Normal rate, regular rhythm, normal heart sounds and intact distal pulses.   Pulmonary/Chest: Effort normal and breath sounds normal. No respiratory distress. She has no wheezes. She has no rales.  Abdominal: Soft. Bowel sounds are normal. There is no tenderness.  Musculoskeletal: She exhibits no edema.  Lymphadenopathy:    She has no cervical adenopathy.  Neurological: She is alert and oriented to person, place, and time.  Skin: Skin is warm and dry.  Nursing note and vitals reviewed.    ED Treatments / Results  DIAGNOSTIC  STUDIES: Oxygen Saturation is 100% on RA, normal by my interpretation.   COORDINATION OF CARE: 12:39 PM- Will prescribe antibiotics, cough medication and nasal spray. Pt verbalizes understanding and agrees to plan.  Medications - No data to display  Labs (all labs ordered are listed, but only abnormal results are displayed) Labs Reviewed - No data to display  EKG  EKG Interpretation None       Radiology No results found.  Procedures Procedures (including critical care time)  Medications Ordered in ED Medications - No data to display   Initial Impression / Assessment and Plan / ED Course  I have reviewed the triage vital signs and the nursing notes.  Pertinent labs & imaging results that were available during my care of the patient were reviewed by me and considered in my medical decision making (see chart for details).     Patient complaining of symptoms of sinusitis. Moderate symptoms have been present for greater than 10 days with purulent nasal discharge and maxillary sinus pain. Concern for acute bacterial rhinosinusitis.  Patient discharged with Augmentin. Instructions given for warm saline nasal wash and recommendations for follow-up with primary care physician.    I personally performed the services described in this documentation, which was scribed in my presence. The recorded information has been reviewed and is accurate.   Final Clinical Impressions(s) / ED Diagnoses   Final diagnoses:  Acute recurrent sinusitis, unspecified location    New Prescriptions New Prescriptions   AMOXICILLIN-CLAVULANATE (AUGMENTIN) 875-125 MG TABLET    Take 1 tablet by mouth every 12 (twelve) hours.   BENZONATATE (TESSALON) 100 MG CAPSULE    Take 2 capsules (200 mg total) by mouth 2 (two) times daily as needed for cough.   OXYMETAZOLINE (AFRIN NASAL SPRAY) 0.05 % NASAL SPRAY    Place 1 spray into both nostrils 2 (two) times daily. Spray once into each nostril twice daily for up  to the next 3 days. Do not use for more than 3 days to prevent rebound rhinorrhea.     Satira Sark [place], [place] 04/13/16 1256    Pricilla Loveless, MD 04/16/16 1031

## 2016-06-15 ENCOUNTER — Encounter (HOSPITAL_COMMUNITY): Payer: Self-pay | Admitting: Emergency Medicine

## 2016-06-15 ENCOUNTER — Emergency Department (HOSPITAL_COMMUNITY)
Admission: EM | Admit: 2016-06-15 | Discharge: 2016-06-15 | Disposition: A | Discharge status: Home / Self Care | Payer: Managed Care, Other (non HMO) | Attending: Emergency Medicine | Admitting: Emergency Medicine

## 2016-06-15 DIAGNOSIS — R51 Headache: Secondary | ICD-10-CM | POA: Insufficient documentation

## 2016-06-15 DIAGNOSIS — Z5321 Procedure and treatment not carried out due to patient leaving prior to being seen by health care provider: Secondary | ICD-10-CM | POA: Insufficient documentation

## 2016-06-15 DIAGNOSIS — R5383 Other fatigue: Secondary | ICD-10-CM | POA: Insufficient documentation

## 2016-06-15 NOTE — ED Triage Notes (Signed)
Patient c/o headache and increased fatigue x2 days. Hx migraines. Denies N/V/D, abdominal pain, light sensitivity, chest pain and SOB at this time. Ambulatory to triage.

## 2016-06-15 NOTE — ED Notes (Signed)
Called patient twice. No answer.

## 2016-06-23 ENCOUNTER — Encounter: Payer: Self-pay | Admitting: Gynecology

## 2016-08-03 ENCOUNTER — Inpatient Hospital Stay (HOSPITAL_COMMUNITY)
Admission: AD | Admit: 2016-08-03 | Discharge: 2016-08-03 | Disposition: A | Discharge status: Home / Self Care | Payer: Self-pay | Source: Ambulatory Visit | Attending: Family Medicine | Admitting: Family Medicine

## 2016-08-03 ENCOUNTER — Encounter (HOSPITAL_COMMUNITY): Payer: Self-pay

## 2016-08-03 DIAGNOSIS — G43909 Migraine, unspecified, not intractable, without status migrainosus: Secondary | ICD-10-CM | POA: Insufficient documentation

## 2016-08-03 DIAGNOSIS — D649 Anemia, unspecified: Secondary | ICD-10-CM | POA: Insufficient documentation

## 2016-08-03 DIAGNOSIS — D500 Iron deficiency anemia secondary to blood loss (chronic): Secondary | ICD-10-CM

## 2016-08-03 DIAGNOSIS — R634 Abnormal weight loss: Secondary | ICD-10-CM | POA: Insufficient documentation

## 2016-08-03 DIAGNOSIS — R5383 Other fatigue: Secondary | ICD-10-CM | POA: Insufficient documentation

## 2016-08-03 DIAGNOSIS — E0590 Thyrotoxicosis, unspecified without thyrotoxic crisis or storm: Secondary | ICD-10-CM | POA: Insufficient documentation

## 2016-08-03 DIAGNOSIS — N939 Abnormal uterine and vaginal bleeding, unspecified: Secondary | ICD-10-CM | POA: Insufficient documentation

## 2016-08-03 LAB — URINALYSIS, ROUTINE W REFLEX MICROSCOPIC
BACTERIA UA: NONE SEEN
BILIRUBIN URINE: NEGATIVE
Glucose, UA: NEGATIVE mg/dL
Ketones, ur: NEGATIVE mg/dL
Leukocytes, UA: NEGATIVE
NITRITE: NEGATIVE
PROTEIN: NEGATIVE mg/dL
SPECIFIC GRAVITY, URINE: 1.009 (ref 1.005–1.030)
pH: 5 (ref 5.0–8.0)

## 2016-08-03 LAB — CBC WITH DIFFERENTIAL/PLATELET
BASOS ABS: 0 10*3/uL (ref 0.0–0.1)
BASOS PCT: 1 %
EOS PCT: 3 %
Eosinophils Absolute: 0.2 10*3/uL (ref 0.0–0.7)
HCT: 26.2 % — ABNORMAL LOW (ref 36.0–46.0)
Hemoglobin: 8.3 g/dL — ABNORMAL LOW (ref 12.0–15.0)
Lymphocytes Relative: 37 %
Lymphs Abs: 2.3 10*3/uL (ref 0.7–4.0)
MCH: 20.6 pg — ABNORMAL LOW (ref 26.0–34.0)
MCHC: 31.7 g/dL (ref 30.0–36.0)
MCV: 65.2 fL — ABNORMAL LOW (ref 78.0–100.0)
MONO ABS: 0.2 10*3/uL (ref 0.1–1.0)
Monocytes Relative: 3 %
NEUTROS ABS: 3.6 10*3/uL (ref 1.7–7.7)
Neutrophils Relative %: 57 %
PLATELETS: 372 10*3/uL (ref 150–400)
RBC: 4.02 MIL/uL (ref 3.87–5.11)
RDW: 19.2 % — AB (ref 11.5–15.5)
WBC: 6.3 10*3/uL (ref 4.0–10.5)

## 2016-08-03 LAB — POCT PREGNANCY, URINE: PREG TEST UR: NEGATIVE

## 2016-08-03 MED ORDER — FERROUS SULFATE 325 (65 FE) MG PO TABS: 325.0000 mg | ORAL_TABLET | Freq: Every day | ORAL | 0 refills | Status: DC

## 2016-08-03 MED ORDER — IBUPROFEN 600 MG PO TABS: 600.0000 mg | ORAL_TABLET | Freq: Four times a day (QID) | ORAL | 0 refills | Status: DC | PRN

## 2016-08-03 NOTE — Discharge Instructions (Signed)
Anemia, Nonspecific °Anemia is a condition in which the concentration of red blood cells or hemoglobin in the blood is below normal. Hemoglobin is a substance in red blood cells that carries oxygen to the tissues of the body. Anemia results in not enough oxygen reaching these tissues. °What are the causes? °Common causes of anemia include: °· Excessive bleeding. Bleeding may be internal or external. This includes excessive bleeding from periods (in women) or from the intestine. °· Poor nutrition. °· Chronic kidney, thyroid, and liver disease. °· Bone marrow disorders that decrease red blood cell production. °· Cancer and treatments for cancer. °· HIV, AIDS, and their treatments. °· Spleen problems that increase red blood cell destruction. °· Blood disorders. °· Excess destruction of red blood cells due to infection, medicines, and autoimmune disorders. °What are the signs or symptoms? °· Minor weakness. °· Dizziness. °· Headache. °· Palpitations. °· Shortness of breath, especially with exercise. °· Paleness. °· Cold sensitivity. °· Indigestion. °· Nausea. °· Difficulty sleeping. °· Difficulty concentrating. °Symptoms may occur suddenly or they may develop slowly. °How is this diagnosed? °Additional blood tests are often needed. These help your health care provider determine the best treatment. Your health care provider will check your stool for blood and look for other causes of blood loss. °How is this treated? °Treatment varies depending on the cause of the anemia. Treatment can include: °· Supplements of iron, vitamin B12, or folic acid. °· Hormone medicines. °· A blood transfusion. This may be needed if blood loss is severe. °· Hospitalization. This may be needed if there is significant continual blood loss. °· Dietary changes. °· Spleen removal. °Follow these instructions at home: °Keep all follow-up appointments. It often takes many weeks to correct anemia, and having your health care provider check on your  condition and your response to treatment is very important. °Get help right away if: °· You develop extreme weakness, shortness of breath, or chest pain. °· You become dizzy or have trouble concentrating. °· You develop heavy vaginal bleeding. °· You develop a rash. °· You have bloody or black, tarry stools. °· You faint. °· You vomit up blood. °· You vomit repeatedly. °· You have abdominal pain. °· You have a fever or persistent symptoms for more than 2-3 days. °· You have a fever and your symptoms suddenly get worse. °· You are dehydrated. °This information is not intended to replace advice given to you by your health care provider. Make sure you discuss any questions you have with your health care provider. °Document Released: 03/04/2004 Document Revised: 07/09/2015 Document Reviewed: 07/21/2012 °Elsevier Interactive Patient Education © 2017 [employer] ° ° °Abnormal Uterine Bleeding °Abnormal uterine bleeding means bleeding more than usual from your uterus. It can include: °· Bleeding between periods. °· Bleeding after sex. °· Bleeding that is heavier than normal. °· Periods that last longer than usual. °· Bleeding after you have stopped having your period (menopause). °There are many problems that may cause this. You should see a doctor for any kind of bleeding that is not normal. Treatment depends on the cause of the bleeding. °Follow these instructions at home: °· Watch your condition for any changes. °· Do not use tampons, douche, or have sex, if your doctor tells you not to. °· Change your pads often. °· Get regular well-woman exams. Make sure they include a pelvic exam and cervical cancer screening. °· Keep all follow-up visits as told by your doctor. This is important. °Contact a doctor if: °· The bleeding   lasts more than one week. °· You feel dizzy at times. °· You feel like you are going to throw up (nauseous). °· You throw up. °Get help right away if: °· You pass out. °· You have to change pads every  hour. °· You have belly (abdominal) pain. °· You have a fever. °· You get sweaty. °· You get weak. °· You passing large blood clots from your vagina. °Summary °· Abnormal uterine bleeding means bleeding more than usual from your uterus. °· There are many problems that may cause this. You should see a doctor for any kind of bleeding that is not normal. °· Treatment depends on the cause of the bleeding. °This information is not intended to replace advice given to you by your health care provider. Make sure you discuss any questions you have with your health care provider. °Document Released: 11/22/2008 Document Revised: 01/20/2016 Document Reviewed: 01/20/2016 °Elsevier Interactive Patient Education © 2017 [employer] ° °

## 2016-08-03 NOTE — MAU Note (Signed)
Pt reports vaginal spotting on Friday/Saturday and heavy bleeding on Sunday. States she has passed "a lot" of baseball sized clots yesterday-none today. States vaginal bleeding is better today. Having lower abdominal cramping that started Sunday, intermittent. States she has not taken anything for pain. States she 1 negative upt at home and 1 positive upt. LMP: 07/10/2016.

## 2016-08-03 NOTE — MAU Provider Note (Signed)
History     CSN: [id number]  Arrival date and time: 08/03/16 2103   First Provider Initiated Contact with Patient 08/03/16 2227      Chief Complaint  Patient presents with  . Vaginal Bleeding   HPI [name] is a 26 y.o. [id number] who presents to MAU today with complaint of vaginal bleeding with clots. The patient states that she had spotting starting last Thursday and then bleeding became heavy with large clots on Sunday. She states bleeding is much lighter now and no large clots recently. She has an appointment to see her PCP on Thursday. She does feel weak, but denies dizziness, worsening SOB or syncope. She has used 4 pads today. She has had intermittent cramping in her lower abdomen and rates that pain at 3/10 now. She had not taken anything for pain today. She denies N/V but has had 4 loose stools today. She denies sick contacts or fever.    OB History    Gravida Para Term Preterm AB Living   5 0   0 3 2   SAB TAB Ectopic Multiple Live Births   0 3 0 0        Past Medical History:  Diagnosis Date  . Fatigue   . Generalized headaches    migraines and generalized   . Hyperthyroidism   . MRSA carrier   . Thyroid disease    hyperthyroid   . Trouble swallowing   . Visual disturbance    eyes bulging, blurry, and hurting   . Weight loss, unintentional     Past Surgical History:  Procedure Laterality Date  . CESAREAN SECTION  11/22/08  . HERNIA REPAIR  1996  . nexplanon  01/23/2015   Nexplanon removed  . TOTAL THYROIDECTOMY  11/26/10    Family History  Problem Relation Age of Onset  . Migraines Mother   . Migraines Brother   . Diabetes Maternal Grandmother   . Hypertension Maternal Grandmother   . Hypertension Maternal Grandfather   . Stroke Maternal Grandfather   . Breast cancer Paternal Grandmother   . Diabetes Paternal Grandmother   . Thyroid disease Paternal Grandmother   . Hypertension Paternal Grandfather     Social History  Substance  Use Topics  . Smoking status: Never Smoker  . Smokeless tobacco: Never Used  . Alcohol use 0.0 oz/week    Allergies:  Allergies  Allergen Reactions  . Doxycycline Rash  . Provera [Medroxyprogesterone Acetate] Rash    No prescriptions prior to admission.    Review of Systems  Constitutional: Negative for fever.  Gastrointestinal: Positive for abdominal pain and diarrhea. Negative for constipation, nausea and vomiting.  Genitourinary: Positive for vaginal bleeding. Negative for dysuria, frequency, urgency and vaginal discharge.   Physical Exam   Blood pressure 134/84, pulse 66, temperature 98.4 F (36.9 C), temperature source Oral, resp. rate 20, height 5\' 4"  (1.626 m), weight 206 lb (93.4 kg), last menstrual period 07/10/2016, SpO2 100 %.  Physical Exam  Nursing note and vitals reviewed. Constitutional: She is oriented to person, place, and time. She appears well-developed and well-nourished. No distress.  HENT:  Head: Normocephalic and atraumatic.  Cardiovascular: Normal rate.   Respiratory: Effort normal.  GI: Soft. She exhibits no distension and no mass. There is tenderness (mild diffuse tenderness to palpation more prominent in the suprapubic and LLQ). There is no rebound and no guarding.  Neurological: She is alert and oriented to person, place, and time.  Skin: Skin is warm  and dry. No erythema.  Psychiatric: She has a normal mood and affect.    Results for orders placed or performed during the hospital encounter of 08/03/16 (from the past 24 hour(s))  Urinalysis, Routine w reflex microscopic     Status: Abnormal   Collection Time: 08/03/16  9:44 PM  Result Value Ref Range   Color, Urine YELLOW YELLOW   APPearance CLEAR CLEAR   Specific Gravity, Urine 1.009 1.005 - 1.030   pH 5.0 5.0 - 8.0   Glucose, UA NEGATIVE NEGATIVE mg/dL   Hgb urine dipstick MODERATE (A) NEGATIVE   Bilirubin Urine NEGATIVE NEGATIVE   Ketones, ur NEGATIVE NEGATIVE mg/dL   Protein, ur  NEGATIVE NEGATIVE mg/dL   Nitrite NEGATIVE NEGATIVE   Leukocytes, UA NEGATIVE NEGATIVE   RBC / HPF TOO NUMEROUS TO COUNT 0 - 5 RBC/hpf   WBC, UA 0-5 0 - 5 WBC/hpf   Bacteria, UA NONE SEEN NONE SEEN   Squamous Epithelial / LPF 0-5 (A) NONE SEEN   Mucous PRESENT   Pregnancy, urine POC     Status: None   Collection Time: 08/03/16 10:07 PM  Result Value Ref Range   Preg Test, Ur NEGATIVE NEGATIVE  CBC with Differential/Platelet     Status: Abnormal   Collection Time: 08/03/16 11:02 PM  Result Value Ref Range   WBC 6.3 4.0 - 10.5 K/uL   RBC 4.02 3.87 - 5.11 MIL/uL   Hemoglobin 8.3 (L) 12.0 - 15.0 g/dL   HCT 16.1 (L) 09.6 - 04.5 %   MCV 65.2 (L) 78.0 - 100.0 fL   MCH 20.6 (L) 26.0 - 34.0 pg   MCHC 31.7 30.0 - 36.0 g/dL   RDW 40.9 (H) 81.1 - 91.4 %   Platelets 372 150 - 400 K/uL   Neutrophils Relative % 57 %   Neutro Abs 3.6 1.7 - 7.7 K/uL   Lymphocytes Relative 37 %   Lymphs Abs 2.3 0.7 - 4.0 K/uL   Monocytes Relative 3 %   Monocytes Absolute 0.2 0.1 - 1.0 K/uL   Eosinophils Relative 3 %   Eosinophils Absolute 0.2 0.0 - 0.7 K/uL   Basophils Relative 1 %   Basophils Absolute 0.0 0.0 - 0.1 K/uL    MAU Course  Procedures None  MDM UPT - negative UA and CBC today  Patient is hemodynamically stable today. Patient would like to follow-up with PCP for management.   Assessment and Plan  A: Abnormal uterine bleeding  Anemia  P: Discharge home Rx for Ibuprofen and iron supplement given  Bleeding precautions discussed Patient advised to follow-up with PCP and GYN if needed Patient may return to MAU as needed or if her condition were to change or worsen   Vonzella Nipple , PA-C 08/04/2016, 1:09 AM

## 2016-08-03 NOTE — Progress Notes (Addendum)
Presents to triage for heavy bleeding, large clots, and cramping since past Sunday. States bleeding started on Thursday. Unsure if pregnant. Last period was end of May. Last unprotected sex was last Monday.   Lab ordered.   2255: Lab notified for stat labs.

## 2017-01-04 ENCOUNTER — Other Ambulatory Visit: Payer: Self-pay

## 2017-01-04 ENCOUNTER — Encounter (HOSPITAL_COMMUNITY): Payer: Self-pay

## 2017-01-04 ENCOUNTER — Inpatient Hospital Stay (HOSPITAL_COMMUNITY)
Admission: AD | Admit: 2017-01-04 | Discharge: 2017-01-04 | Disposition: A | Discharge status: Home / Self Care | Payer: Self-pay | Source: Ambulatory Visit | Attending: Obstetrics and Gynecology | Admitting: Obstetrics and Gynecology

## 2017-01-04 ENCOUNTER — Inpatient Hospital Stay (HOSPITAL_COMMUNITY): Payer: Self-pay

## 2017-01-04 DIAGNOSIS — E0590 Thyrotoxicosis, unspecified without thyrotoxic crisis or storm: Secondary | ICD-10-CM | POA: Insufficient documentation

## 2017-01-04 DIAGNOSIS — G43909 Migraine, unspecified, not intractable, without status migrainosus: Secondary | ICD-10-CM | POA: Insufficient documentation

## 2017-01-04 DIAGNOSIS — O209 Hemorrhage in early pregnancy, unspecified: Secondary | ICD-10-CM | POA: Insufficient documentation

## 2017-01-04 DIAGNOSIS — O99281 Endocrine, nutritional and metabolic diseases complicating pregnancy, first trimester: Secondary | ICD-10-CM | POA: Insufficient documentation

## 2017-01-04 DIAGNOSIS — Z3A01 Less than 8 weeks gestation of pregnancy: Secondary | ICD-10-CM | POA: Insufficient documentation

## 2017-01-04 DIAGNOSIS — R109 Unspecified abdominal pain: Secondary | ICD-10-CM | POA: Insufficient documentation

## 2017-01-04 DIAGNOSIS — O26891 Other specified pregnancy related conditions, first trimester: Secondary | ICD-10-CM | POA: Insufficient documentation

## 2017-01-04 DIAGNOSIS — R1031 Right lower quadrant pain: Secondary | ICD-10-CM

## 2017-01-04 LAB — CBC
HEMATOCRIT: 28.2 % — AB (ref 36.0–46.0)
HEMOGLOBIN: 9 g/dL — AB (ref 12.0–15.0)
MCH: 21.4 pg — AB (ref 26.0–34.0)
MCHC: 31.9 g/dL (ref 30.0–36.0)
MCV: 67.1 fL — ABNORMAL LOW (ref 78.0–100.0)
Platelets: 411 10*3/uL — ABNORMAL HIGH (ref 150–400)
RBC: 4.2 MIL/uL (ref 3.87–5.11)
RDW: 20.5 % — AB (ref 11.5–15.5)
WBC: 6 10*3/uL (ref 4.0–10.5)

## 2017-01-04 LAB — COMPREHENSIVE METABOLIC PANEL
ALBUMIN: 3.8 g/dL (ref 3.5–5.0)
ALK PHOS: 69 U/L (ref 38–126)
ALT: 11 U/L — ABNORMAL LOW (ref 14–54)
ANION GAP: 4 — AB (ref 5–15)
AST: 14 U/L — ABNORMAL LOW (ref 15–41)
BILIRUBIN TOTAL: 0.3 mg/dL (ref 0.3–1.2)
BUN: 6 mg/dL (ref 6–20)
CALCIUM: 8.9 mg/dL (ref 8.9–10.3)
CO2: 25 mmol/L (ref 22–32)
Chloride: 107 mmol/L (ref 101–111)
Creatinine, Ser: 0.59 mg/dL (ref 0.44–1.00)
GFR calc non Af Amer: >60 mL/min (ref >60)
Glucose, Bld: 92 mg/dL (ref 65–99)
POTASSIUM: 3.5 mmol/L (ref 3.5–5.1)
SODIUM: 136 mmol/L (ref 135–145)
TOTAL PROTEIN: 7.3 g/dL (ref 6.5–8.1)

## 2017-01-04 LAB — HCG, QUANTITATIVE, PREGNANCY: HCG, BETA CHAIN, QUANT, S: 11178 m[IU]/mL — AB (ref <5)

## 2017-01-04 LAB — ABO/RH: ABO/RH(D): O POS

## 2017-01-04 LAB — URINALYSIS, ROUTINE W REFLEX MICROSCOPIC
BILIRUBIN URINE: NEGATIVE
Glucose, UA: NEGATIVE mg/dL
HGB URINE DIPSTICK: NEGATIVE
KETONES UR: NEGATIVE mg/dL
Leukocytes, UA: NEGATIVE
NITRITE: NEGATIVE
Protein, ur: NEGATIVE mg/dL
SPECIFIC GRAVITY, URINE: 1.011 (ref 1.005–1.030)
pH: 5 (ref 5.0–8.0)

## 2017-01-04 LAB — POCT PREGNANCY, URINE: PREG TEST UR: POSITIVE — AB

## 2017-01-04 NOTE — Discharge Instructions (Signed)
First Trimester of Pregnancy The first trimester of pregnancy is from week 1 until the end of week 13 (months 1 through 3). During this time, your baby will begin to develop inside you. At 6-8 weeks, the eyes and face are formed, and the heartbeat can be seen on ultrasound. At the end of 12 weeks, all the baby's organs are formed. Prenatal care is all the medical care you receive before the birth of your baby. Make sure you get good prenatal care and follow all of your doctor's instructions. Follow these instructions at home: Medicines  Take over-the-counter and prescription medicines only as told by your doctor. Some medicines are safe and some medicines are not safe during pregnancy.  Take a prenatal vitamin that contains at least 600 micrograms (mcg) of folic acid.  If you have trouble pooping (constipation), take medicine that will make your stool soft (stool softener) if your doctor approves. Eating and drinking  Eat regular, healthy meals.  Your doctor will tell you the amount of weight gain that is right for you.  Avoid raw meat and uncooked cheese.  If you feel sick to your stomach (nauseous) or throw up (vomit): ? Eat 4 or 5 small meals a day instead of 3 large meals. ? Try eating a few soda crackers. ? Drink liquids between meals instead of during meals.  To prevent constipation: ? Eat foods that are high in fiber, like fresh fruits and vegetables, whole grains, and beans. ? Drink enough fluids to keep your pee (urine) clear or pale yellow. Activity  Exercise only as told by your doctor. Stop exercising if you have cramps or pain in your lower belly (abdomen) or low back.  Do not exercise if it is too hot, too humid, or if you are in a place of great height (high altitude).  Try to avoid standing for long periods of time. Move your legs often if you must stand in one place for a long time.  Avoid heavy lifting.  Wear low-heeled shoes. Sit and stand up straight.  You  can have sex unless your doctor tells you not to. Relieving pain and discomfort  Wear a good support bra if your breasts are sore.  Take warm water baths (sitz baths) to soothe pain or discomfort caused by hemorrhoids. Use hemorrhoid cream if your doctor says it is okay.  Rest with your legs raised if you have leg cramps or low back pain.  If you have puffy, bulging veins (varicose veins) in your legs: ? Wear support hose or compression stockings as told by your doctor. ? Raise (elevate) your feet for 15 minutes, 3-4 times a day. ? Limit salt in your food. Prenatal care  Schedule your prenatal visits by the twelfth week of pregnancy.  Write down your questions. Take them to your prenatal visits.  Keep all your prenatal visits as told by your doctor. This is important. Safety  Wear your seat belt at all times when driving.  Make a list of emergency phone numbers. The list should include numbers for family, friends, the hospital, and police and fire departments. General instructions  Ask your doctor for a referral to a local prenatal class. Begin classes no later than at the start of month 6 of your pregnancy.  Ask for help if you need counseling or if you need help with nutrition. Your doctor can give you advice or tell you where to go for help.  Do not use hot tubs, steam rooms, or   saunas.  Do not douche or use tampons or scented sanitary pads.  Do not cross your legs for long periods of time.  Avoid all herbs and alcohol. Avoid drugs that are not approved by your doctor.  Do not use any tobacco products, including cigarettes, chewing tobacco, and electronic cigarettes. If you need help quitting, ask your doctor. You may get counseling or other support to help you quit.  Avoid cat litter boxes and soil used by cats. These carry germs that can cause birth defects in the baby and can cause a loss of your baby (miscarriage) or stillbirth.  Visit your dentist. At home, brush  your teeth with a soft toothbrush. Be gentle when you floss. Contact a doctor if:  You are dizzy.  You have mild cramps or pressure in your lower belly.  You have a nagging pain in your belly area.  You continue to feel sick to your stomach, you throw up, or you have watery poop (diarrhea).  You have a bad smelling fluid coming from your vagina.  You have pain when you pee (urinate).  You have increased puffiness (swelling) in your face, hands, legs, or ankles. Get help right away if:  You have a fever.  You are leaking fluid from your vagina.  You have spotting or bleeding from your vagina.  You have very bad belly cramping or pain.  You gain or lose weight rapidly.  You throw up blood. It may look like coffee grounds.  You are around people who have German measles, fifth disease, or chickenpox.  You have a very bad headache.  You have shortness of breath.  You have any kind of trauma, such as from a fall or a car accident. Summary  The first trimester of pregnancy is from week 1 until the end of week 13 (months 1 through 3).  To take care of yourself and your unborn baby, you will need to eat healthy meals, take medicines only if your doctor tells you to do so, and do activities that are safe for you and your baby.  Keep all follow-up visits as told by your doctor. This is important as your doctor will have to ensure that your baby is healthy and growing well. This information is not intended to replace advice given to you by your health care provider. Make sure you discuss any questions you have with your health care provider. Document Released: 07/14/2007 Document Revised: 02/03/2016 Document Reviewed: 02/03/2016 Elsevier Interactive Patient Education  2017 [employer]  

## 2017-01-04 NOTE — MAU Note (Signed)
Pt states she started having sharp pain last Friday 01/01/2017 in her abdominal area and radiates to the right side. She describes the pain as sharp and intermittent 8/10.   Pt states she has tried 800 mg ibuprofen that she states "takes the edge off".

## 2017-01-04 NOTE — MAU Provider Note (Signed)
Patient Tanya Chapman is a 26 y.o. [phone] At Unknown gestation here with pain that she has had on her right side since Friday.  She is also late on her period.  She denies any abnormal discharge, dysuria, pelvic pain or other ob-gyn complaint.  History     CSN: [id number]  Arrival date and time: 01/04/17 1444   First Provider Initiated Contact with Patient 01/04/17 1615      Chief Complaint  Patient presents with  . Abdominal Pain   Abdominal Pain  This is a new problem. The current episode started in the past 7 days. The onset quality is sudden. The problem occurs intermittently. The problem has been unchanged. The pain is located in the RLQ. The pain is at a severity of 8/10. The quality of the pain is aching and sharp. The abdominal pain does not radiate. Pertinent negatives include no diarrhea, dysuria, nausea or vomiting. Nothing aggravates the pain. The pain is relieved by nothing.    OB History    Gravida Para Term Preterm AB Living   6 0   0 3 2   SAB TAB Ectopic Multiple Live Births   0 3 0 0        Past Medical History:  Diagnosis Date  . Fatigue   . Generalized headaches    migraines and generalized   . Hyperthyroidism   . MRSA carrier   . Thyroid disease    hyperthyroid   . Trouble swallowing   . Visual disturbance    eyes bulging, blurry, and hurting   . Weight loss, unintentional     Past Surgical History:  Procedure Laterality Date  . CESAREAN SECTION  11/22/08  . HERNIA REPAIR  1996  . nexplanon  01/23/2015   Nexplanon removed  . TOTAL THYROIDECTOMY  11/26/10    Family History  Problem Relation Age of Onset  . Migraines Mother   . Migraines Brother   . Diabetes Maternal Grandmother   . Hypertension Maternal Grandmother   . Hypertension Maternal Grandfather   . Stroke Maternal Grandfather   . Breast cancer Paternal Grandmother   . Diabetes Paternal Grandmother   . Thyroid disease Paternal Grandmother   . Hypertension Paternal  Grandfather     Social History   Tobacco Use  . Smoking status: Never Smoker  . Smokeless tobacco: Never Used  Substance Use Topics  . Alcohol use: Yes    Alcohol/week: 0.0 oz    Comment: socially  . Drug use: No    Allergies:  Allergies  Allergen Reactions  . Doxycycline Rash  . Provera [Medroxyprogesterone Acetate] Rash    Medications Prior to Admission  Medication Sig Dispense Refill Last Dose  . ferrous sulfate 325 (65 FE) MG tablet Take 1 tablet (325 mg total) by mouth daily. 30 tablet 0 01/03/2017 at Unknown time  . ibuprofen (ADVIL,MOTRIN) 200 MG tablet Take 800 mg by mouth every 6 (six) hours as needed for moderate pain.   01/03/2017 at Unknown time  . levothyroxine (SYNTHROID, LEVOTHROID) 175 MCG tablet Take 175 mcg by mouth daily. Takes with 50 mcg tab daily   01/04/2017 at Unknown time  . levothyroxine (SYNTHROID, LEVOTHROID) 50 MCG tablet Take 50 mcg by mouth daily. Takes with 175 mcg tab daily   01/04/2017 at Unknown time  . ibuprofen (ADVIL,MOTRIN) 600 MG tablet Take 1 tablet (600 mg total) by mouth every 6 (six) hours as needed. (Patient not taking: Reported on 01/04/2017) 30 tablet 0 Not Taking at  Unknown time  . oxymetazoline (AFRIN NASAL SPRAY) 0.05 % nasal spray Place 1 spray into both nostrils 2 (two) times daily. Spray once into each nostril twice daily for up to the next 3 days. Do not use for more than 3 days to prevent rebound rhinorrhea. (Patient not taking: Reported on 01/04/2017) 30 mL 0 Not Taking at Unknown time    Review of Systems  Constitutional: Negative.   HENT: Negative.   Respiratory: Negative.   Cardiovascular: Negative.   Gastrointestinal: Positive for abdominal pain. Negative for diarrhea, nausea and vomiting.  Genitourinary: Negative for dysuria.  Musculoskeletal: Negative.   Neurological: Negative.   Psychiatric/Behavioral: Negative.    Physical Exam   Blood pressure 129/62, pulse 89, temperature 98.9 F (37.2 C), temperature  source Oral, resp. rate 16, height 5\' 4"  (1.626 m), weight 209 lb 12 oz (95.1 kg), SpO2 100 %.  Physical Exam  Constitutional: She appears well-developed and well-nourished.  HENT:  Head: Normocephalic.  Neck: Normal range of motion.  Respiratory: Effort normal.  GI: Soft.  Genitourinary:  Genitourinary Comments: NEFG: tenderness on right adnexa on deep palpation. No CMT, no suprapubic tenderness.     MAU Course  Procedures  MDM Patient had positive pregnancy test and so an ectopic work-up was done.  YS seen, no cardiac activity yet.  Patient was tearful about her diagnosis; is not sure about whether or not she wants to keep the pregnancy.  US Ob Comp Less 14 Wks  Result Date: 01/04/2017 CLINICAL DATA:  Initial evaluation for acute lower abdominal pain, right greater than left. Early pregnancy. EXAM: OBSTETRIC <14 WK [place] AND TRANSVAGINAL OB US TECHNIQUE: Both transabdominal and transvaginal ultrasound examinations were performed for complete evaluation of the gestation as well as the maternal uterus, adnexal regions, and pelvic cul-de-sac. Transvaginal technique was performed to assess early pregnancy. COMPARISON:  None available. FINDINGS: Intrauterine gestational sac: Single Yolk sac:  Present Embryo:  Not visualized. Cardiac Activity: N/A Heart Rate: N/A  bpm MSD: 10.7  mm   5 w   5  d Subchorionic hemorrhage:  None visualized. Maternal uterus/adnexae: Ovaries well visualized within the bilateral adnexa and are normal in appearance. No adnexal mass. Trace free fluid within the pelvis, likely physiologic. IMPRESSION: 1. Single intrauterine gestational sac with internal yolk sac, but no fetal pole or cardiac activity yet visualized. Estimated gestational age [redacted] weeks and 5 days by mean sac diameter. No complication. 2. No other acute maternal uterine or adnexal abnormality identified. Electronically Signed   By: Rise Mu M.D.   On: 01/04/2017 18:35   US Ob Transvaginal  Result  Date: 01/04/2017 CLINICAL DATA:  Initial evaluation for acute lower abdominal pain, right greater than left. Early pregnancy. EXAM: OBSTETRIC <14 WK [place] AND TRANSVAGINAL OB US TECHNIQUE: Both transabdominal and transvaginal ultrasound examinations were performed for complete evaluation of the gestation as well as the maternal uterus, adnexal regions, and pelvic cul-de-sac. Transvaginal technique was performed to assess early pregnancy. COMPARISON:  None available. FINDINGS: Intrauterine gestational sac: Single Yolk sac:  Present Embryo:  Not visualized. Cardiac Activity: N/A Heart Rate: N/A  bpm MSD: 10.7  mm   5 w   5  d Subchorionic hemorrhage:  None visualized. Maternal uterus/adnexae: Ovaries well visualized within the bilateral adnexa and are normal in appearance. No adnexal mass. Trace free fluid within the pelvis, likely physiologic. IMPRESSION: 1. Single intrauterine gestational sac with internal yolk sac, but no fetal pole or cardiac activity yet visualized. Estimated gestational age  [redacted] weeks and 5 days by mean sac diameter. No complication. 2. No other acute maternal uterine or adnexal abnormality identified. Electronically Signed   By: Rise MuBenjamin  McClintock M.D.   On: 01/04/2017 18:35     Assessment and Plan   1. Right lower quadrant abdominal pain   2. Bleeding in early pregnancy    3. Patient's US, physical exam and lab values discussed with Dr. Elon SpannerLeger, who agrees patient stable for discharge with recommendations to follow-up with ob care   4. Reviewed return precautions; patient verbalized understanding. All questions answered.   Charlesetta GaribaldiKathryn Lorraine Kooistra CNM 01/04/2017, 6:34 PM

## 2017-02-13 ENCOUNTER — Emergency Department (HOSPITAL_COMMUNITY)
Admission: EM | Admit: 2017-02-13 | Discharge: 2017-02-13 | Discharge status: Against Medical Advice | Payer: Managed Care, Other (non HMO)

## 2017-02-13 ENCOUNTER — Other Ambulatory Visit: Payer: Self-pay

## 2017-02-13 NOTE — ED Notes (Signed)
Called x 1 no answer

## 2017-06-14 ENCOUNTER — Inpatient Hospital Stay (HOSPITAL_COMMUNITY)
Admission: AD | Admit: 2017-06-14 | Discharge: 2017-06-14 | Disposition: A | Discharge status: Home / Self Care | Payer: 59 | Source: Ambulatory Visit | Attending: Obstetrics & Gynecology | Admitting: Obstetrics & Gynecology

## 2017-06-14 ENCOUNTER — Encounter (HOSPITAL_COMMUNITY): Payer: Self-pay

## 2017-06-14 DIAGNOSIS — E0590 Thyrotoxicosis, unspecified without thyrotoxic crisis or storm: Secondary | ICD-10-CM | POA: Insufficient documentation

## 2017-06-14 DIAGNOSIS — R102 Pelvic and perineal pain: Secondary | ICD-10-CM | POA: Insufficient documentation

## 2017-06-14 DIAGNOSIS — G43909 Migraine, unspecified, not intractable, without status migrainosus: Secondary | ICD-10-CM | POA: Insufficient documentation

## 2017-06-14 DIAGNOSIS — Z22322 Carrier or suspected carrier of Methicillin resistant Staphylococcus aureus: Secondary | ICD-10-CM | POA: Insufficient documentation

## 2017-06-14 DIAGNOSIS — Z8249 Family history of ischemic heart disease and other diseases of the circulatory system: Secondary | ICD-10-CM | POA: Insufficient documentation

## 2017-06-14 DIAGNOSIS — N921 Excessive and frequent menstruation with irregular cycle: Secondary | ICD-10-CM | POA: Diagnosis not present

## 2017-06-14 DIAGNOSIS — Z881 Allergy status to other antibiotic agents status: Secondary | ICD-10-CM | POA: Insufficient documentation

## 2017-06-14 DIAGNOSIS — N938 Other specified abnormal uterine and vaginal bleeding: Secondary | ICD-10-CM | POA: Insufficient documentation

## 2017-06-14 DIAGNOSIS — Z793 Long term (current) use of hormonal contraceptives: Secondary | ICD-10-CM | POA: Insufficient documentation

## 2017-06-14 LAB — CBC
HCT: 32.6 % — ABNORMAL LOW (ref 36.0–46.0)
HEMOGLOBIN: 10.8 g/dL — AB (ref 12.0–15.0)
MCH: 23.2 pg — ABNORMAL LOW (ref 26.0–34.0)
MCHC: 33.1 g/dL (ref 30.0–36.0)
MCV: 70 fL — AB (ref 78.0–100.0)
Platelets: 333 10*3/uL (ref 150–400)
RBC: 4.66 MIL/uL (ref 3.87–5.11)
RDW: 19.3 % — ABNORMAL HIGH (ref 11.5–15.5)
WBC: 5.6 10*3/uL (ref 4.0–10.5)

## 2017-06-14 LAB — URINALYSIS, ROUTINE W REFLEX MICROSCOPIC
BILIRUBIN URINE: NEGATIVE
Glucose, UA: NEGATIVE mg/dL
HGB URINE DIPSTICK: NEGATIVE
Ketones, ur: NEGATIVE mg/dL
Leukocytes, UA: NEGATIVE
NITRITE: NEGATIVE
PROTEIN: NEGATIVE mg/dL
SPECIFIC GRAVITY, URINE: 1.013 (ref 1.005–1.030)
pH: 6 (ref 5.0–8.0)

## 2017-06-14 LAB — WET PREP, GENITAL
Sperm: NONE SEEN
Trich, Wet Prep: NONE SEEN
YEAST WET PREP: NONE SEEN

## 2017-06-14 LAB — POCT PREGNANCY, URINE: PREG TEST UR: NEGATIVE

## 2017-06-14 NOTE — MAU Provider Note (Signed)
History     CSN: [id number]  Arrival date and time: 06/14/17 4098   First Provider Initiated Contact with Patient 06/14/17 1013      Chief Complaint  Patient presents with  . Vaginal Bleeding   Vaginal Bleeding  The patient's primary symptoms include pelvic pain and vaginal bleeding. This is a new problem. The current episode started more than 1 month ago (started apprx end of March). The problem has been unchanged. Pain severity now: 3/10. The problem affects both sides. She is not pregnant. Vaginal bleeding amount: started like a normal period, but now it is like a light period.  She has not been passing clots. She has not been passing tissue. Nothing aggravates the symptoms. She has tried nothing for the symptoms. She is sexually active. She uses progestin injections (Dr. Chauncy Passy office is giving her injections. Last injection was beginning of April. ) for contraception. Her menstrual history has been irregular.   Past Medical History:  Diagnosis Date  . Fatigue   . Generalized headaches    migraines and generalized   . Hyperthyroidism   . MRSA carrier   . Thyroid disease    hyperthyroid   . Trouble swallowing   . Visual disturbance    eyes bulging, blurry, and hurting   . Weight loss, unintentional     Past Surgical History:  Procedure Laterality Date  . CESAREAN SECTION  11/22/08  . HERNIA REPAIR  1996  . nexplanon  01/23/2015   Nexplanon removed  . TOTAL THYROIDECTOMY  11/26/10    Family History  Problem Relation Age of Onset  . Migraines Mother   . Migraines Brother   . Diabetes Maternal Grandmother   . Hypertension Maternal Grandmother   . Hypertension Maternal Grandfather   . Stroke Maternal Grandfather   . Breast cancer Paternal Grandmother   . Diabetes Paternal Grandmother   . Thyroid disease Paternal Grandmother   . Hypertension Paternal Grandfather     Social History   Tobacco Use  . Smoking status: Never Smoker  . Smokeless tobacco: Never Used   Substance Use Topics  . Alcohol use: Yes    Alcohol/week: 0.0 oz    Comment: socially  . Drug use: No    Allergies:  Allergies  Allergen Reactions  . Doxycycline Rash  . Provera [Medroxyprogesterone Acetate] Rash    Medications Prior to Admission  Medication Sig Dispense Refill Last Dose  . ferrous sulfate 325 (65 FE) MG tablet Take 1 tablet (325 mg total) by mouth daily. 30 tablet 0 01/03/2017 at Unknown time  . levothyroxine (SYNTHROID, LEVOTHROID) 175 MCG tablet Take 175 mcg by mouth daily. Takes with 50 mcg tab daily   01/04/2017 at Unknown time  . levothyroxine (SYNTHROID, LEVOTHROID) 50 MCG tablet Take 50 mcg by mouth daily. Takes with 175 mcg tab daily   01/04/2017 at Unknown time  . oxymetazoline (AFRIN NASAL SPRAY) 0.05 % nasal spray Place 1 spray into both nostrils 2 (two) times daily. Spray once into each nostril twice daily for up to the next 3 days. Do not use for more than 3 days to prevent rebound rhinorrhea. (Patient not taking: Reported on 01/04/2017) 30 mL 0 Not Taking at Unknown time    Review of Systems  Genitourinary: Positive for pelvic pain and vaginal bleeding.   Physical Exam   Blood pressure 116/83, pulse 68, temperature 98.7 F (37.1 C), temperature source Oral, resp. rate 16, weight 191 lb (86.6 kg), last menstrual period 05/08/2017, SpO2 100 %,  unknown if currently breastfeeding.  Physical Exam  Nursing note and vitals reviewed. Constitutional: She is oriented to person, place, and time. She appears well-developed and well-nourished. No distress.  HENT:  Head: Normocephalic.  Cardiovascular: Normal rate.  Respiratory: Effort normal.  GI: Soft. There is no tenderness. There is no rebound.  Genitourinary:  Genitourinary Comments:  External: no lesion Vagina: scant amount of brown blood noted  Cervix: pink, smooth, no CMT Uterus: NSSC Adnexa: NT   Neurological: She is alert and oriented to person, place, and time.  Skin: Skin is warm and dry.   Psychiatric: She has a normal mood and affect.     Results for orders placed or performed during the hospital encounter of 06/14/17 (from the past 24 hour(s))  Urinalysis, Routine w reflex microscopic     Status: None   Collection Time: 06/14/17  9:19 AM  Result Value Ref Range   Color, Urine YELLOW YELLOW   APPearance CLEAR CLEAR   Specific Gravity, Urine 1.013 1.005 - 1.030   pH 6.0 5.0 - 8.0   Glucose, UA NEGATIVE NEGATIVE mg/dL   Hgb urine dipstick NEGATIVE NEGATIVE   Bilirubin Urine NEGATIVE NEGATIVE   Ketones, ur NEGATIVE NEGATIVE mg/dL   Protein, ur NEGATIVE NEGATIVE mg/dL   Nitrite NEGATIVE NEGATIVE   Leukocytes, UA NEGATIVE NEGATIVE  Pregnancy, urine POC     Status: None   Collection Time: 06/14/17  9:27 AM  Result Value Ref Range   Preg Test, Ur NEGATIVE NEGATIVE  Wet prep, genital     Status: Abnormal   Collection Time: 06/14/17 10:23 AM  Result Value Ref Range   Yeast Wet Prep HPF POC NONE SEEN NONE SEEN   Trich, Wet Prep NONE SEEN NONE SEEN   Clue Cells Wet Prep HPF POC PRESENT (A) NONE SEEN   WBC, Wet Prep HPF POC FEW (A) NONE SEEN   Sperm NONE SEEN   CBC     Status: Abnormal   Collection Time: 06/14/17 10:27 AM  Result Value Ref Range   WBC 5.6 4.0 - 10.5 K/uL   RBC 4.66 3.87 - 5.11 MIL/uL   Hemoglobin 10.8 (L) 12.0 - 15.0 g/dL   HCT 54.0 (L) 98.1 - 19.1 %   MCV 70.0 (L) 78.0 - 100.0 fL   MCH 23.2 (L) 26.0 - 34.0 pg   MCHC 33.1 30.0 - 36.0 g/dL   RDW 47.8 (H) 29.5 - 62.1 %   Platelets 333 150 - 400 K/uL    MAU Course  Procedures  MDM DW patient that depo can sometimes cause bleeding irregularities. Recommend that she see her PCP that has been prescribing the depo for follow up.   Assessment and Plan   1. Breakthrough bleeding on Depo-Provera    DC home Comfort measures reviewed  Bleeding precautions RX: none  Return to MAU as needed FU with OB as planned  Follow-up Information    Lewis Moccasin, MD Follow up.   Specialty:  Family  Medicine Contact information: [address] [place] [address] [phone]            Thressa Sheller 06/14/2017, 10:14 AM

## 2017-06-14 NOTE — MAU Note (Signed)
Pt is having bleeding for about 6 weeks. Is heavy and lighter at times with some lower right abdominal pain 6/10. Is on depo and typically doesn't have bleeding.

## 2017-06-14 NOTE — Discharge Instructions (Signed)

## 2017-06-15 LAB — HIV ANTIBODY (ROUTINE TESTING W REFLEX): HIV Screen 4th Generation wRfx: NONREACTIVE

## 2017-06-15 LAB — GC/CHLAMYDIA PROBE AMP (~~LOC~~) NOT AT ARMC
CHLAMYDIA, DNA PROBE: NEGATIVE
Neisseria Gonorrhea: NEGATIVE

## 2017-07-24 ENCOUNTER — Encounter (HOSPITAL_COMMUNITY): Payer: Self-pay | Admitting: Nurse Practitioner

## 2017-07-24 DIAGNOSIS — N926 Irregular menstruation, unspecified: Secondary | ICD-10-CM | POA: Insufficient documentation

## 2017-07-24 DIAGNOSIS — Z79899 Other long term (current) drug therapy: Secondary | ICD-10-CM | POA: Insufficient documentation

## 2017-07-24 DIAGNOSIS — R102 Pelvic and perineal pain: Secondary | ICD-10-CM | POA: Insufficient documentation

## 2017-07-24 LAB — I-STAT BETA HCG BLOOD, ED (MC, WL, AP ONLY): I-stat hCG, quantitative: <5 m[IU]/mL (ref <5)

## 2017-07-24 NOTE — ED Triage Notes (Signed)
Pt is c/o pelvic pain that has been ongoing for a few months. Also states she experiences painful sexual intercourse followed by bleeding. She states she would like to get checked out for ovarian cysts.

## 2017-07-25 ENCOUNTER — Emergency Department (HOSPITAL_COMMUNITY)
Admission: EM | Admit: 2017-07-25 | Discharge: 2017-07-25 | Disposition: A | Discharge status: Home / Self Care | Payer: 59 | Attending: Emergency Medicine | Admitting: Emergency Medicine

## 2017-07-25 ENCOUNTER — Emergency Department (HOSPITAL_COMMUNITY): Payer: 59

## 2017-07-25 DIAGNOSIS — N926 Irregular menstruation, unspecified: Secondary | ICD-10-CM

## 2017-07-25 DIAGNOSIS — R102 Pelvic and perineal pain: Secondary | ICD-10-CM

## 2017-07-25 LAB — URINALYSIS, ROUTINE W REFLEX MICROSCOPIC
BILIRUBIN URINE: NEGATIVE
Glucose, UA: NEGATIVE mg/dL
HGB URINE DIPSTICK: NEGATIVE
Ketones, ur: NEGATIVE mg/dL
Leukocytes, UA: NEGATIVE
Nitrite: NEGATIVE
PH: 6 (ref 5.0–8.0)
Protein, ur: NEGATIVE mg/dL
SPECIFIC GRAVITY, URINE: 1.013 (ref 1.005–1.030)

## 2017-07-25 MED ORDER — IBUPROFEN 600 MG PO TABS: 600.0000 mg | ORAL_TABLET | Freq: Four times a day (QID) | ORAL | 0 refills | Status: DC | PRN

## 2017-07-25 NOTE — ED Provider Notes (Signed)
[facility]-EMERGENCY DEPT Provider Note   CSN: [id number] Arrival date & time: 07/24/17  2210     History   Chief Complaint Chief Complaint  Patient presents with  . Pelvic Pain    HPI Tanya Chapman is a 27 y.o. female.  Patient presents with pelvic pain that has been ongoing for several weeks, increasing duration of newly irregular periods and dyspareunia. No fever, nausea. She reports minimal dark red or brownish vaginal discharge. No urinary symptoms. She denies lightheadedness, near syncope. She is not currently bleeding heavily.   The history is provided by the patient. No language interpreter was used.    Past Medical History:  Diagnosis Date  . Fatigue   . Generalized headaches    migraines and generalized   . Hyperthyroidism   . MRSA carrier   . Thyroid disease    hyperthyroid   . Trouble swallowing   . Visual disturbance    eyes bulging, blurry, and hurting   . Weight loss, unintentional     Patient Active Problem List   Diagnosis Date Noted  . Dysmenorrhea 10/22/2011  . Menorrhagia 10/22/2011  . Thyroid goiter 11/03/2010  . Grave's disease 11/03/2010    Past Surgical History:  Procedure Laterality Date  . CESAREAN SECTION  11/22/08  . HERNIA REPAIR  1996  . nexplanon  01/23/2015   Nexplanon removed  . TOTAL THYROIDECTOMY  11/26/10     OB History    Gravida  6   Para  0   Term      Preterm  0   AB  4   Living  2     SAB  0   TAB  4   Ectopic  0   Multiple  0   Live Births               Home Medications    Prior to Admission medications   Medication Sig Start Date End Date Taking? Authorizing Provider  ferrous sulfate 325 (65 FE) MG tablet Take 1 tablet (325 mg total) by mouth daily. Patient taking differently: Take 325 mg by mouth 4 (four) times daily.  08/03/16   Marny Lowenstein, PA-C  ibuprofen (ADVIL,MOTRIN) 200 MG tablet Take 200 mg by mouth every 6 (six) hours as needed for headache,  mild pain or cramping.    [provider]  levothyroxine (SYNTHROID, LEVOTHROID) 175 MCG tablet Take 175 mcg by mouth daily. Takes with 50 mcg tab daily 05/11/16   [provider]  levothyroxine (SYNTHROID, LEVOTHROID) 50 MCG tablet Take 50 mcg by mouth daily. Takes with 175 mcg tab daily 05/11/16   [provider]  medroxyPROGESTERone Acetate 150 MG/ML SUSY Inject 150 mg into the muscle every 3 (three) months.  05/31/17   [provider]    Family History Family History  Problem Relation Age of Onset  . Migraines Mother   . Migraines Brother   . Diabetes Maternal Grandmother   . Hypertension Maternal Grandmother   . Hypertension Maternal Grandfather   . Stroke Maternal Grandfather   . Breast cancer Paternal Grandmother   . Diabetes Paternal Grandmother   . Thyroid disease Paternal Grandmother   . Hypertension Paternal Grandfather     Social History Social History   Tobacco Use  . Smoking status: Never Smoker  . Smokeless tobacco: Never Used  Substance Use Topics  . Alcohol use: Yes    Alcohol/week: 0.0 oz    Comment: socially  . Drug  use: No     Allergies   Doxycycline and Provera [medroxyprogesterone acetate]   Review of Systems Review of Systems  Constitutional: Negative for chills and fever.  HENT: Negative.   Respiratory: Negative.  Negative for shortness of breath.   Gastrointestinal: Negative.  Negative for abdominal pain, nausea and vomiting.  Genitourinary: Positive for menstrual problem, pelvic pain and vaginal discharge. Negative for dysuria.  Musculoskeletal: Negative.   Skin: Negative.   Neurological: Negative.  Negative for syncope and weakness.     Physical Exam Updated Vital Signs BP (!) 145/90 (BP Location: Left Arm)   Pulse 78   Temp 98 F (36.7 C) (Oral)   Resp 18   LMP  (LMP Unknown)   SpO2 100%   Physical Exam  Constitutional: She is oriented to person, place, and time. She appears well-developed and  well-nourished. No distress.  Eyes:  No conjunctival pallor  Pulmonary/Chest: Effort normal.  Abdominal: Soft. There is no tenderness.  Genitourinary:  Genitourinary Comments: Scant cervical bleeding, appears old. No purulent vaginal discharge. No CMT or right adnexal tenderness. She has left adnexal tenderness without mass.   Neurological: She is alert and oriented to person, place, and time.  Skin: Skin is warm and dry.     ED Treatments / Results  Labs (all labs ordered are listed, but only abnormal results are displayed) Labs Reviewed  URINALYSIS, ROUTINE W REFLEX MICROSCOPIC  I-STAT BETA HCG BLOOD, ED ([facility], [facility], [facility] ONLY)  GC/CHLAMYDIA PROBE AMP ([place]) NOT AT [facility]   Results for orders placed or performed during the hospital encounter of 07/25/17  Urinalysis, Routine w reflex microscopic  Result Value Ref Range   Color, Urine YELLOW YELLOW   APPearance CLEAR CLEAR   Specific Gravity, Urine 1.013 1.005 - 1.030   pH 6.0 5.0 - 8.0   Glucose, UA NEGATIVE NEGATIVE mg/dL   Hgb urine dipstick NEGATIVE NEGATIVE   Bilirubin Urine NEGATIVE NEGATIVE   Ketones, ur NEGATIVE NEGATIVE mg/dL   Protein, ur NEGATIVE NEGATIVE mg/dL   Nitrite NEGATIVE NEGATIVE   Leukocytes, UA NEGATIVE NEGATIVE  I-Stat beta hCG blood, ED  Result Value Ref Range   I-stat hCG, quantitative <5.0 <5 mIU/mL   Comment 3             EKG None  Radiology US Pelvic Complete With Transvaginal  Result Date: 07/25/2017 CLINICAL DATA:  Pelvic pain for 2 weeks. EXAM: TRANSABDOMINAL AND TRANSVAGINAL ULTRASOUND OF PELVIS TECHNIQUE: Both transabdominal and transvaginal ultrasound examinations of the pelvis were performed. Transabdominal technique was performed for global imaging of the pelvis including uterus, ovaries, adnexal regions, and pelvic cul-de-sac. It was necessary to proceed with endovaginal exam following the transabdominal exam to visualize the ovaries and endometrium. COMPARISON:  None FINDINGS: Uterus  Measurements: 8.9 x 4.1 x 5 cm. No fibroids or other mass visualized. Endometrium Thickness: 7.8 mm.  No focal abnormality visualized. Right ovary Measurements: 4.9 x 3.5 x 3.8 cm. Normal appearance/no adnexal mass. Left ovary Measurements: 3.7 x 2.8 x 2.5 cm. Normal appearance/no adnexal mass. Other findings Flow is demonstrated on both ovaries with color flow Doppler imaging. Arterial and venous spectral flow velocity waveforms are obtained with both ovaries. No free fluid in the pelvis. IMPRESSION: Normal examination.  No evidence of ovarian mass or torsion. Electronically Signed   By: Burman Nieves M.D.   On: 07/25/2017 04:36    Procedures Procedures (including critical care time)  Medications Ordered in ED Medications - No data to display  Initial Impression / Assessment and Plan / ED Course  I have reviewed the triage vital signs and the nursing notes.  Pertinent labs & imaging results that were available during my care of the patient were reviewed by me and considered in my medical decision making (see chart for details).     Patient is here with longstanding symptoms or irregular menses with heavy bleeding (none currently) vaginal discharge and dyspareunia. No fever at any time.   She has discharge that appears like old blood. No purulent discharge or CMT. Left adnexal tenderness.   Labs are reassuring. No anemia or evidence of infection. Wet prep lost to lab evaluation. Discussed this with the patient. She would prefer not to repeat. Based on exam, doubt infection. CG/chlamydia pending. She is offered STD treatment which she declines, opting to wait until cultures are resulted.   Discussed follow up with GYN for further management of irregular, heavy periods.   Final Clinical Impressions(s) / ED Diagnoses   Final diagnoses:  Pelvic pain   1. Pelvic pain 2. Irregular menses  ED Discharge Orders    None       Elpidio AnisUpstill, Gladys Gutman, Cordelia Poche-C 07/25/17 [id number]    Glynn Octaveancour, Stephen,  MD 07/25/17 [phone]

## 2017-07-25 NOTE — Discharge Instructions (Addendum)
Follow up with gynecology for further evaluation of pelvic pain and for irregular periods with heavy bleeding. Your hemoglobin is slightly low today which can be caused by heavy periods, but is stable and better than previously tested. Return here with any new or concerning symptoms.

## 2017-07-26 LAB — GC/CHLAMYDIA PROBE AMP (~~LOC~~) NOT AT ARMC
CHLAMYDIA, DNA PROBE: NEGATIVE
Neisseria Gonorrhea: NEGATIVE

## 2018-02-17 ENCOUNTER — Inpatient Hospital Stay (HOSPITAL_COMMUNITY): Payer: Self-pay

## 2018-02-17 ENCOUNTER — Inpatient Hospital Stay (HOSPITAL_COMMUNITY)
Admission: AD | Admit: 2018-02-17 | Discharge: 2018-02-17 | Disposition: A | Discharge status: Home / Self Care | Payer: Self-pay | Attending: Family Medicine | Admitting: Family Medicine

## 2018-02-17 ENCOUNTER — Encounter (HOSPITAL_COMMUNITY): Payer: Self-pay

## 2018-02-17 DIAGNOSIS — O23591 Infection of other part of genital tract in pregnancy, first trimester: Secondary | ICD-10-CM | POA: Insufficient documentation

## 2018-02-17 DIAGNOSIS — Z3A01 Less than 8 weeks gestation of pregnancy: Secondary | ICD-10-CM | POA: Insufficient documentation

## 2018-02-17 DIAGNOSIS — O209 Hemorrhage in early pregnancy, unspecified: Secondary | ICD-10-CM | POA: Insufficient documentation

## 2018-02-17 DIAGNOSIS — B9689 Other specified bacterial agents as the cause of diseases classified elsewhere: Secondary | ICD-10-CM | POA: Insufficient documentation

## 2018-02-17 DIAGNOSIS — O200 Threatened abortion: Secondary | ICD-10-CM | POA: Insufficient documentation

## 2018-02-17 LAB — WET PREP, GENITAL
Sperm: NONE SEEN
Trich, Wet Prep: NONE SEEN
Yeast Wet Prep HPF POC: NONE SEEN

## 2018-02-17 LAB — POCT PREGNANCY, URINE: PREG TEST UR: POSITIVE — AB

## 2018-02-17 LAB — URINALYSIS, ROUTINE W REFLEX MICROSCOPIC
BILIRUBIN URINE: NEGATIVE
Bacteria, UA: NONE SEEN
Glucose, UA: NEGATIVE mg/dL
HGB URINE DIPSTICK: NEGATIVE
KETONES UR: NEGATIVE mg/dL
NITRITE: NEGATIVE
Protein, ur: NEGATIVE mg/dL
Specific Gravity, Urine: 1.013 (ref 1.005–1.030)
pH: 5 (ref 5.0–8.0)

## 2018-02-17 LAB — CBC
HEMATOCRIT: 36.3 % (ref 36.0–46.0)
HEMOGLOBIN: 12.1 g/dL (ref 12.0–15.0)
MCH: 27.4 pg (ref 26.0–34.0)
MCHC: 33.3 g/dL (ref 30.0–36.0)
MCV: 82.3 fL (ref 80.0–100.0)
Platelets: 346 10*3/uL (ref 150–400)
RBC: 4.41 MIL/uL (ref 3.87–5.11)
RDW: 14.7 % (ref 11.5–15.5)
WBC: 6.1 10*3/uL (ref 4.0–10.5)
nRBC: 0 % (ref 0.0–0.2)

## 2018-02-17 LAB — HCG, QUANTITATIVE, PREGNANCY: hCG, Beta Chain, Quant, S: 10475 m[IU]/mL — ABNORMAL HIGH (ref <5)

## 2018-02-17 MED ORDER — METRONIDAZOLE 500 MG PO TABS: 500.0000 mg | ORAL_TABLET | Freq: Two times a day (BID) | ORAL | 0 refills | Status: DC

## 2018-02-17 NOTE — MAU Provider Note (Signed)
History     CSN: [id number]  Arrival date and time: 02/17/18 1523   First Provider Initiated Contact with Patient 02/17/18 1650      Chief Complaint  Patient presents with  . Vaginal Bleeding  . Abdominal Pain  . Back Pain   Tanya Chapman is a 28 y.o. [id number] at [redacted]w[redacted]d by unsure LMP of January 20, 2018 who presents for Vaginal Bleeding; Abdominal Pain; and Back Pain. Patient states she took a home UPT about one week ago and has been bleeding daily. She states that she has not had any bleeding today and it was not heavy when it was occuring.  She states it was a brownish-pink color and upon cessation she is now having a yellowish discharge.  She also c/o abdominal pain for the last week that she describes as intermittent cramping that lasts a few hours and then resolves with rest.  Patient c/o back pain onset today that she describes as cramping and aching in nature.  She rates her pain as 6/10 when it occurs and has not taken any medication.  She endorses constipation and had a bowel movement on Sunday, but denies diarrhea, dysuria, and N/V.  She reports drinking about 2 glasses of water daily and states she mostly drinks vitamin water.  She denies recent change in partner in the last 12 months and denies sexual activity in the last 48 hours.     OB History    Gravida  7   Para  2   Term  2   Preterm  0   AB  4   Living  2     SAB  0   TAB  4   Ectopic  0   Multiple  0   Live Births  2           Past Medical History:  Diagnosis Date  . Fatigue   . Generalized headaches    migraines and generalized   . Hyperthyroidism   . MRSA carrier   . Thyroid disease    hyperthyroid   . Trouble swallowing   . Visual disturbance    eyes bulging, blurry, and hurting   . Weight loss, unintentional     Past Surgical History:  Procedure Laterality Date  . CESAREAN SECTION  11/22/08  . HERNIA REPAIR  1996  . nexplanon  01/23/2015   Nexplanon removed  . TOTAL  THYROIDECTOMY  11/26/10    Family History  Problem Relation Age of Onset  . Migraines Mother   . Migraines Brother   . Diabetes Maternal Grandmother   . Hypertension Maternal Grandmother   . Hypertension Maternal Grandfather   . Stroke Maternal Grandfather   . Breast cancer Paternal Grandmother   . Diabetes Paternal Grandmother   . Thyroid disease Paternal Grandmother   . Hypertension Paternal Grandfather     Social History   Tobacco Use  . Smoking status: Never Smoker  . Smokeless tobacco: Never Used  Substance Use Topics  . Alcohol use: Not Currently    Alcohol/week: 0.0 standard drinks    Comment: socially  . Drug use: No    Allergies:  Allergies  Allergen Reactions  . Doxycycline Rash  . Provera [Medroxyprogesterone Acetate] Rash    Medications Prior to Admission  Medication Sig Dispense Refill Last Dose  . ferrous sulfate 325 (65 FE) MG tablet Take 1 tablet (325 mg total) by mouth daily. (Patient taking differently: Take 325 mg by mouth 4 (four) times daily. )  30 tablet 0 06/14/2017 at Unknown time  . ibuprofen (ADVIL,MOTRIN) 600 MG tablet Take 1 tablet (600 mg total) by mouth every 6 (six) hours as needed. 30 tablet 0   . levothyroxine (SYNTHROID, LEVOTHROID) 175 MCG tablet Take 175 mcg by mouth daily. Takes with 50 mcg tab daily   06/14/2017 at Unknown time  . levothyroxine (SYNTHROID, LEVOTHROID) 50 MCG tablet Take 50 mcg by mouth daily. Takes with 175 mcg tab daily   06/14/2017 at Unknown time  . medroxyPROGESTERone Acetate 150 MG/ML SUSY Inject 150 mg into the muscle every 3 (three) months.   4 Past Month at Unknown time    Review of Systems Physical Exam   Blood pressure 119/76, pulse 70, temperature 98.6 F (37 C), temperature source Oral, resp. rate 18, height 5\' 4"  (1.626 m), weight 90.7 kg, last menstrual period 01/20/2018, unknown if currently breastfeeding.  Physical Exam  MAU Course  Procedures Results for orders placed or performed during the  hospital encounter of 02/17/18 (from the past 24 hour(s))  Urinalysis, Routine w reflex microscopic     Status: Abnormal   Collection Time: 02/17/18  4:39 PM  Result Value Ref Range   Color, Urine YELLOW YELLOW   APPearance CLEAR CLEAR   Specific Gravity, Urine 1.013 1.005 - 1.030   pH 5.0 5.0 - 8.0   Glucose, UA NEGATIVE NEGATIVE mg/dL   Hgb urine dipstick NEGATIVE NEGATIVE   Bilirubin Urine NEGATIVE NEGATIVE   Ketones, ur NEGATIVE NEGATIVE mg/dL   Protein, ur NEGATIVE NEGATIVE mg/dL   Nitrite NEGATIVE NEGATIVE   Leukocytes, UA TRACE (A) NEGATIVE   RBC / HPF 0-5 0 - 5 RBC/hpf   WBC, UA 0-5 0 - 5 WBC/hpf   Bacteria, UA NONE SEEN NONE SEEN   Squamous Epithelial / LPF 0-5 0 - 5   Mucus PRESENT   Pregnancy, urine POC     Status: Abnormal   Collection Time: 02/17/18  4:42 PM  Result Value Ref Range   Preg Test, Ur POSITIVE (A) NEGATIVE  CBC     Status: None   Collection Time: 02/17/18  5:16 PM  Result Value Ref Range   WBC 6.1 4.0 - 10.5 K/uL   RBC 4.41 3.87 - 5.11 MIL/uL   Hemoglobin 12.1 12.0 - 15.0 g/dL   HCT 35.5 97.4 - 16.3 %   MCV 82.3 80.0 - 100.0 fL   MCH 27.4 26.0 - 34.0 pg   MCHC 33.3 30.0 - 36.0 g/dL   RDW 84.5 36.4 - 68.0 %   Platelets 346 150 - 400 K/uL   nRBC 0.0 0.0 - 0.2 %  hCG, quantitative, pregnancy     Status: Abnormal   Collection Time: 02/17/18  5:16 PM  Result Value Ref Range   hCG, Beta Chain, Quant, S 10,475 (H) <5 mIU/mL  Wet prep, genital     Status: Abnormal   Collection Time: 02/17/18  5:22 PM  Result Value Ref Range   Yeast Wet Prep HPF POC NONE SEEN NONE SEEN   Trich, Wet Prep NONE SEEN NONE SEEN   Clue Cells Wet Prep HPF POC PRESENT (A) NONE SEEN   WBC, Wet Prep HPF POC MANY (A) NONE SEEN   Sperm NONE SEEN    US FINDINGS: Intrauterine gestational sac: Single; irregular sac shape and location in lower uterine segment noted  Yolk sac:  Visualized.  Embryo:  Visualized.  Cardiac Activity: Visualized.  Heart Rate: 49 bpm  CRL:   4 mm   6  w   0 d                  US EDC: 10/13/2018  Subchorionic hemorrhage: Moderate-sized subchorionic hemorrhage is seen.  Maternal uterus/adnexae: Both ovaries are normal appearance. No mass or abnormal free fluid identified.  IMPRESSION: Single living IUP measuring 6 weeks 0 days, with US EDC of  10/13/2018. Irregular sac shape, location in lower uterine segment, and subchorionic hemorrhage are poor prognostic signs.  MDM Pelvic Exam; Wet prep and Gc/CT Labs: UA, UPT, CBC, hCG US for viability and assessment of VB  Assessment and Plan  IUP at 4wks by Unsure LMP Vaginal Bleeding  -Exam findings discussed -Labs Pending -Informed of need for US -Offered and declined medication for pain citing hypothyroid diagnosis -Will await results   Follow Up (7:27 PM) IUP at 6wks by US Threatened Abortion SCH Bacterial Vaginosis  -Informed of US findings. -Educated on need for follow up with hCG levels. -Appt scheduled for lab visit on Jan 13 at 0900 for repeat quant. -Encouraged pelvic rest to avoid pain.  Further informed that if patient desires to engage in sexual activity that this will not be the cause of miscarriage if it was to occur. -Educated regarding miscarriages and bleeding precautions given. -Patient verbalized understanding and had no q/c regarding findings or follow up care. -Wet prep returns significant for clue cells -Discussed ways to prevent bacterial vaginosis:  +Wear cotton underwear +Use low scent or scent free soaps and detergents +No douching -Encouraged to call or return to MAU if symptoms worsen or with the onset of new symptoms. -Discharged to home in stable condition   Cherre RobinsJessica L Sandy Haye MSN, CNM 02/17/2018, 4:52 PM

## 2018-02-17 NOTE — MAU Note (Signed)
Pt had pos HPT last week, started bleeding the next day.  Also C/O lower back & abdominal pain, intermittent. When not bleeding she is having a yellow discharge.

## 2018-02-17 NOTE — Discharge Instructions (Signed)
Threatened Miscarriage  A threatened miscarriage occurs when a woman has vaginal bleeding during the first 20 weeks of pregnancy but the pregnancy has not ended. If you have vaginal bleeding during this time, your health care provider will do tests to make sure you are still pregnant. If the tests show that you are still pregnant and that the developing baby (fetus) inside your uterus is still growing, your condition is considered a threatened miscarriage. A threatened miscarriage does not mean your pregnancy will end, but it does increase the risk of losing your pregnancy (complete miscarriage). What are the causes? The cause of this condition is usually not known. For women who go on to have a complete miscarriage, the most common cause is an abnormal number of chromosomes in the developing baby. Chromosomes are the structures inside cells that hold all of a person's genetic material. What increases the risk? The following lifestyle factors may increase your risk of a miscarriage in early pregnancy:  Smoking.  Drinking excessive amounts of alcohol or caffeine.  Recreational drug use. The following preexisting health conditions may increase your risk of a miscarriage in early pregnancy:  Polycystic ovary syndrome.  Uterine fibroids.  Infections.  Diabetes mellitus. What are the signs or symptoms? Symptoms of this condition include:  Vaginal bleeding.  Mild abdominal pain or cramps. How is this diagnosed? If you have bleeding with or without abdominal pain before 20 weeks of pregnancy, your health care provider will do tests to check whether you are still pregnant. These will include:  Ultrasound. This test uses sound waves to create images of the inside of your uterus. This allows your health care provider to look at your developing baby and other structures, such as your placenta.  Pelvic exam. This is an internal exam of your vagina and cervix.  Measurement of your baby's heart  rate.  Laboratory tests such as blood tests, urine tests, or swabs for infection You may be diagnosed with a threatened miscarriage if:  Ultrasound testing shows that you are still pregnant.  Your baby's heart rate is strong.  A pelvic exam shows that the opening between your uterus and your vagina (cervix) is closed.  Blood tests confirm that you are still pregnant. How is this treated? No treatments have been shown to prevent a threatened miscarriage from going on to a complete miscarriage. However, the right home care is important. Follow these instructions at home:  Get plenty of rest.  Do not have sex or use tampons if you have vaginal bleeding.  Do not douche.  Do not smoke or use recreational drugs.  Do not drink alcohol.  Avoid caffeine.  Keep all follow-up prenatal visits as told by your health care provider. This is important. Contact a health care provider if:  You have light vaginal bleeding or spotting while pregnant.  You have abdominal pain or cramping.  You have a fever. Get help right away if:  You have heavy vaginal bleeding.  You have blood clots coming from your vagina.  You pass tissue from your vagina.  You leak fluid, or you have a gush of fluid from your vagina.  You have severe low back pain or abdominal cramps.  You have fever, chills, and severe abdominal pain. Summary  A threatened miscarriage occurs when a woman has vaginal bleeding during the first 20 weeks of pregnancy but the pregnancy has not ended.  The cause of a threatened miscarriage is usually not known.  Symptoms of this condition may   include vaginal bleeding and mild abdominal pain or cramps.  No treatments have been shown to prevent a threatened miscarriage from going on to a complete miscarriage.  Keep all follow-up prenatal visits as told by your health care provider. This is important. This information is not intended to replace advice given to you by your health  care provider. Make sure you discuss any questions you have with your health care provider. Document Released: 01/25/2005 Document Revised: 04/23/2016 Document Reviewed: 04/23/2016 Elsevier Interactive Patient Education  2019 [employer]  

## 2018-02-20 ENCOUNTER — Ambulatory Visit (INDEPENDENT_AMBULATORY_CARE_PROVIDER_SITE_OTHER): Payer: Self-pay | Admitting: General Practice

## 2018-02-20 DIAGNOSIS — O200 Threatened abortion: Secondary | ICD-10-CM

## 2018-02-20 LAB — HCG, QUANTITATIVE, PREGNANCY: hCG, Beta Chain, Quant, S: 10424 m[IU]/mL — ABNORMAL HIGH (ref <5)

## 2018-02-20 LAB — GC/CHLAMYDIA PROBE AMP (~~LOC~~) NOT AT ARMC
Chlamydia: NEGATIVE
Neisseria Gonorrhea: NEGATIVE

## 2018-02-20 NOTE — Progress Notes (Signed)
Patient presents to office today for stat bhcg following recent MAU visit. Patient reports she started bleeding last night similar to a period and reports off/on cramps. Discussed with patient we are monitoring your bhcg levels today and asked she wait in lobby for results/updated plan of care. Patient verbalized understanding & had no questions at this time.  Reviewed results with Dr Vergie Living who finds bhcg levels have plateaued- patient should have follow up ultrasound this week with provider follow up same day. Scheduled u/s 1/16 @ 2pm.   Informed patient of results & ultrasound appt with provider visit to follow. Precautions reviewed with patient. Patient verbalized understanding & had no questions at this time.  Chase Caller RN BSN 02/20/18

## 2018-02-21 NOTE — Progress Notes (Signed)
I have reviewed the chart and agree with nursing staff's documentation of this patient's encounter.  Ashonte Angelucci, MD 02/21/2018 11:09 AM    

## 2018-02-23 ENCOUNTER — Encounter: Payer: Self-pay | Admitting: Obstetrics and Gynecology

## 2018-02-23 ENCOUNTER — Ambulatory Visit (HOSPITAL_COMMUNITY)
Admission: RE | Admit: 2018-02-23 | Discharge: 2018-02-23 | Disposition: A | Discharge status: Home / Self Care | Payer: Self-pay | Source: Ambulatory Visit | Attending: Obstetrics and Gynecology | Admitting: Obstetrics and Gynecology

## 2018-02-23 ENCOUNTER — Ambulatory Visit (INDEPENDENT_AMBULATORY_CARE_PROVIDER_SITE_OTHER): Payer: Self-pay | Admitting: Obstetrics and Gynecology

## 2018-02-23 DIAGNOSIS — O200 Threatened abortion: Secondary | ICD-10-CM | POA: Insufficient documentation

## 2018-02-23 DIAGNOSIS — O034 Incomplete spontaneous abortion without complication: Secondary | ICD-10-CM | POA: Insufficient documentation

## 2018-02-23 DIAGNOSIS — O039 Complete or unspecified spontaneous abortion without complication: Secondary | ICD-10-CM

## 2018-02-23 MED ORDER — METRONIDAZOLE 500 MG PO TABS: 500.0000 mg | ORAL_TABLET | Freq: Two times a day (BID) | ORAL | 0 refills | Status: DC

## 2018-02-23 NOTE — Progress Notes (Signed)
Ms Roys presents for U/S for probable inevitable AB. U/S 02/17/18 demonstrated irregular GS in lower uterine segment with cardiac activity No bleeding at that time  U/S today demonstrates the same today with no further growth progress low cardiac rate  Pt reports some cramps  U/S results reviewed with pt  PE AF VSS Lungs clear  Heart RRR  A/P Inevitable AB  Bleeding precautions reviewed with pt F/U U/S in 2 weeks Blood type O +

## 2018-03-09 ENCOUNTER — Ambulatory Visit (HOSPITAL_COMMUNITY): Payer: Self-pay

## 2018-03-09 ENCOUNTER — Ambulatory Visit: Payer: Self-pay | Admitting: Advanced Practice Midwife

## 2018-03-09 ENCOUNTER — Ambulatory Visit: Payer: Self-pay | Admitting: Obstetrics & Gynecology

## 2018-03-10 ENCOUNTER — Ambulatory Visit (INDEPENDENT_AMBULATORY_CARE_PROVIDER_SITE_OTHER): Payer: Self-pay | Admitting: Family Medicine

## 2018-03-10 ENCOUNTER — Ambulatory Visit (HOSPITAL_COMMUNITY)
Admission: RE | Admit: 2018-03-10 | Discharge: 2018-03-10 | Disposition: A | Discharge status: Home / Self Care | Payer: Self-pay | Source: Ambulatory Visit | Attending: Obstetrics and Gynecology | Admitting: Obstetrics and Gynecology

## 2018-03-10 ENCOUNTER — Encounter: Payer: Self-pay | Admitting: Family Medicine

## 2018-03-10 VITALS — BP 120/81 | HR 63 | Wt 196.6 lb

## 2018-03-10 DIAGNOSIS — O034 Incomplete spontaneous abortion without complication: Secondary | ICD-10-CM

## 2018-03-10 DIAGNOSIS — O039 Complete or unspecified spontaneous abortion without complication: Secondary | ICD-10-CM | POA: Insufficient documentation

## 2018-03-10 MED ORDER — MISOPROSTOL 200 MCG PO TABS: ORAL_TABLET | ORAL | 0 refills | Status: DC

## 2018-03-10 MED ORDER — PROMETHAZINE HCL 12.5 MG PO TABS: 12.5000 mg | ORAL_TABLET | Freq: Four times a day (QID) | ORAL | 0 refills | Status: DC | PRN

## 2018-03-10 NOTE — Patient Instructions (Signed)

## 2018-03-10 NOTE — Progress Notes (Signed)
GYNECOLOGY OFFICE VISIT NOTE History:  28 y.o. [id number] here today for follow-up of inevitable AB. Had U/S this morning which showed no intrauterine gestational sac but did show endometrial complex with focal area with blood versus POC. Today having spotting, some minimal cramping but not too painful. Had heavier bleeding for last week. States has been anticipiating having miscarriage but just wondering how we can confirm everything is truly gone. Emotions are up/down, just wants things to be over.   - birth control pills for contraception - wants sometime to help with regular periods, had trouble with IUD and Nexplanon in the past   The following portions of the patient's history were reviewed and updated as appropriate: allergies, current medications, past family history, past medical history, past social history, past surgical history and problem list.   Review of Systems:  Pertinent items noted in HPI Review of Systems  Constitutional: Negative for chills, fever and malaise/fatigue.  Respiratory: Negative for shortness of breath.   Cardiovascular: Negative for chest pain and palpitations.  Gastrointestinal: Negative for diarrhea, nausea and vomiting.  Genitourinary: Negative for dysuria.  Neurological: Negative for dizziness and headaches.  Psychiatric/Behavioral: The patient is nervous/anxious.    Objective:  Physical Exam BP 120/81   Pulse 63   Wt 89.2 kg   LMP 01/20/2018 (Approximate)   BMI 33.75 kg/m  Physical Exam  Constitutional: She is oriented to person, place, and time. She appears well-developed and well-nourished. No distress.  HENT:  Head: Normocephalic and atraumatic.  Eyes: Conjunctivae and EOM are normal. No scleral icterus.  Cardiovascular: Normal rate.  Pulmonary/Chest: No respiratory distress.  Genitourinary:    Genitourinary Comments: deferred   Neurological: She is alert and oriented to person, place, and time.  Psychiatric: She has a normal mood and  affect. Her behavior is normal.  Occasionally tearful  Nursing note and vitals reviewed.  Labs and Imaging Results for orders placed or performed in visit on 03/10/18 (from the past 168 hour(s))  Beta hCG quant (ref lab)   Collection Time: 03/10/18 10:31 AM  Result Value Ref Range   hCG Quant 3,255 mIU/mL  CBC   Collection Time: 03/10/18 10:31 AM  Result Value Ref Range   WBC 4.8 3.4 - 10.8 x10E3/uL   RBC 4.40 3.77 - 5.28 x10E6/uL   Hemoglobin 12.3 11.1 - 15.9 g/dL   Hematocrit 45.437.0 09.834.0 - 46.6 %   MCV 84 79 - 97 fL   MCH 28.0 26.6 - 33.0 pg   MCHC 33.2 31.5 - 35.7 g/dL   RDW 11.914.4 14.711.7 - 82.915.4 %   Platelets 385 150 - 450 x10E3/uL  ABO/Rh   Collection Time: 03/10/18 10:31 AM  Result Value Ref Range   ABO Grouping O    Rh Factor Positive    [place] Ob Comp Less 14 Wks  Result Date: 02/17/2018 CLINICAL DATA:  Vaginal bleeding.  Unsure of LMP. EXAM: OBSTETRIC <14 WK US AND TRANSVAGINAL OB US TECHNIQUE: Both transabdominal and transvaginal ultrasound examinations were performed for complete evaluation of the gestation as well as the maternal uterus, adnexal regions, and pelvic cul-de-sac. Transvaginal technique was performed to assess early pregnancy. COMPARISON:  None. FINDINGS: Intrauterine gestational sac: Single; irregular sac shape and location in lower uterine segment noted Yolk sac:  Visualized. Embryo:  Visualized. Cardiac Activity: Visualized. Heart Rate: 49 bpm CRL:  4 mm   6 w   0 d  [place] EDC: 10/13/2018 Subchorionic hemorrhage: Moderate-sized subchorionic hemorrhage is seen. Maternal uterus/adnexae: Both ovaries are normal appearance. No mass or abnormal free fluid identified. IMPRESSION: Single living IUP measuring 6 weeks 0 days, with [place] EDC of 10/13/2018. Irregular sac shape, location in lower uterine segment, and subchorionic hemorrhage are poor prognostic signs. Electronically Signed   By: Myles Rosenthal M.D.   On: 02/17/2018 18:57   US Ob Transvaginal  Result Date:  03/10/2018 CLINICAL DATA:  Pregnant, inevitable abortion EXAM: TRANSVAGINAL OB ULTRASOUND TECHNIQUE: Transvaginal ultrasound was performed for complete evaluation of the gestation as well as the maternal uterus, adnexal regions, and pelvic cul-de-sac. COMPARISON:  02/23/2018 FINDINGS: Intrauterine gestational sac: None visualized; previously identified gestational sac no longer seen Yolk sac:  N/A Embryo:  N/A Cardiac Activity: N/A Heart Rate: N/A bpm MSD:   mm    w     d CRL:     mm    w  d                  [place] EDC: Subchorionic hemorrhage:  N/A Maternal uterus/adnexae: Normal uterine morphology without mass Endometrial complex contains a focal area of heterogeneity at the mid uterine segment 10 x 20 x 17 mm question blood versus retained products of conception; no blood flow attended 5 within this region on color Doppler imaging. Normal appearing ovaries bilaterally. No free pelvic fluid or adnexal masses. IMPRESSION: Spontaneous abortion, with previously identified intrauterine gestational sac no longer visualized. Focal area of heterogeneous echogenicity within the endometrial complex at the mid uterine segment measuring 10 x 20 x 17 mm question blood/clot versus retained products of conception within endometrial canal. Electronically Signed   By: Ulyses Southward M.D.   On: 03/10/2018 08:22   US Ob Transvaginal  Result Date: 02/23/2018 CLINICAL DATA:  Bleeding in early pregnancy; EGA [redacted] weeks 6 days by first ultrasound EXAM: TRANSVAGINAL OB ULTRASOUND TECHNIQUE: Transvaginal ultrasound was performed for complete evaluation of the gestation as well as the maternal uterus, adnexal regions, and pelvic cul-de-sac. COMPARISON:  02/17/2018 FINDINGS: Intrauterine gestational sac: Present, single, slightly irregular Yolk sac:  Present Embryo:  Present Cardiac Activity: Present Heart Rate: 45 bpm MSD: 10.9 mm   5 w   5 d CRL:   2.2 mm   5 w 5 d                  [place] EDC: 10/21/2018 Subchorionic hemorrhage:  Small subchronic  hemorrhage Maternal uterus/adnexae: RIGHT ovary normal size and morphology 1.5 x 4.2 x 1.8 cm. LEFT ovary normal size and morphology, 1.9 x 3.1 x 1.6 cm. Trace free pelvic fluid. No adnexal masses. IMPRESSION: Single live intrauterine gestation with crown-rump length correlating with an EGA of [redacted] weeks 5 days. However, fetal heart rate is low, the gestational sac is irregular, and there has been a lack of expected growth of the fetal pole since the prior study; these are poor prognostic indicators. Electronically Signed   By: Ulyses Southward M.D.   On: 02/23/2018 15:15   US Ob Transvaginal  Result Date: 02/17/2018 CLINICAL DATA:  Vaginal bleeding.  Unsure of LMP. EXAM: OBSTETRIC <14 WK [place] AND TRANSVAGINAL OB US TECHNIQUE: Both transabdominal and transvaginal ultrasound examinations were performed for complete evaluation of the gestation as well as the maternal uterus, adnexal regions, and pelvic cul-de-sac. Transvaginal technique was performed to assess early pregnancy. COMPARISON:  None. FINDINGS: Intrauterine gestational sac: Single; irregular sac shape and location in lower uterine segment noted  Yolk sac:  Visualized. Embryo:  Visualized. Cardiac Activity: Visualized. Heart Rate: 49 bpm CRL:  4 mm   6 w   0 d                  [place] EDC: 10/13/2018 Subchorionic hemorrhage: Moderate-sized subchorionic hemorrhage is seen. Maternal uterus/adnexae: Both ovaries are normal appearance. No mass or abnormal free fluid identified. IMPRESSION: Single living IUP measuring 6 weeks 0 days, with [place] EDC of 10/13/2018. Irregular sac shape, location in lower uterine segment, and subchorionic hemorrhage are poor prognostic signs. Electronically Signed   By: Myles RosenthalJohn  Stahl M.D.   On: 02/17/2018 18:57    Assessment & Plan:   40JW 28 who is s/p spontaneous abortion. Prior U/S with likely inevitable AB, FHR in 40s. Ultrasound today with previously identified intrauterine gestational sac no longer visualized but also with  heterogenous echogenicity within the endometrial complex - question blood/clot versus retained products of conception. Now minimal bleeding but had more bleeding this past week. Discussed options for expectant management, misoprostol, or possible need for D&C at later time. She opted for misoprostol. Counseled on use, administration, side effects. Will return next week for repeat beta hCG.   Early Intrauterine Pregnancy Failure [x]  Documented intrauterine pregnancy failure less than or equal to [redacted] weeks gestation [x]  No serious current illness [x]  Baseline Hgb greater than or equal to 10g/dl [x]  Patient has easily accessible transportation to the hospital [x]  Clear preference [x]  Practitioner/physician deems patient reliable [x]  Counseling by practitioner or physician [/] O+ blood type, Rhogam not needed [x]  Cytotec 800 mcg  . Intravaginally by patient at home - instructions to repeat in 72 hours if no change [ ]  Ibuprofen 600 mg 1 tablet by mouth every 6 hours as needed  [ ]  Phenergan 12.5 mg by mouth every 4 hours as needed for nausea  Counseled on birth control, would like to try OCPs.   Total face-to-face time with patient: 30 minutes.  Over 50% of encounter was spent on counseling and coordination of care.  Cristal DeerLaurel S. Earlene PlaterWallace, DO OB Family Medicine Fellow, [facility], [facility]

## 2018-03-11 LAB — CBC
HEMATOCRIT: 37 % (ref 34.0–46.6)
Hemoglobin: 12.3 g/dL (ref 11.1–15.9)
MCH: 28 pg (ref 26.6–33.0)
MCHC: 33.2 g/dL (ref 31.5–35.7)
MCV: 84 fL (ref 79–97)
Platelets: 385 10*3/uL (ref 150–450)
RBC: 4.4 x10E6/uL (ref 3.77–5.28)
RDW: 14.4 % (ref 11.7–15.4)
WBC: 4.8 10*3/uL (ref 3.4–10.8)

## 2018-03-11 LAB — BETA HCG QUANT (REF LAB): hCG Quant: 3255 m[IU]/mL

## 2018-03-11 LAB — ABO/RH: Rh Factor: POSITIVE

## 2018-03-15 ENCOUNTER — Other Ambulatory Visit: Payer: Self-pay | Admitting: *Deleted

## 2018-03-15 DIAGNOSIS — O039 Complete or unspecified spontaneous abortion without complication: Secondary | ICD-10-CM

## 2018-03-16 ENCOUNTER — Other Ambulatory Visit: Payer: Self-pay

## 2018-03-17 ENCOUNTER — Other Ambulatory Visit: Payer: Self-pay

## 2018-03-17 DIAGNOSIS — O039 Complete or unspecified spontaneous abortion without complication: Secondary | ICD-10-CM

## 2018-03-18 LAB — BETA HCG QUANT (REF LAB): hCG Quant: 1344 m[IU]/mL

## 2018-03-24 ENCOUNTER — Encounter: Payer: Self-pay | Admitting: Family Medicine

## 2018-03-24 ENCOUNTER — Ambulatory Visit (INDEPENDENT_AMBULATORY_CARE_PROVIDER_SITE_OTHER): Payer: Self-pay | Admitting: Family Medicine

## 2018-03-24 VITALS — BP 114/87 | HR 74 | Ht 64.0 in | Wt 197.4 lb

## 2018-03-24 DIAGNOSIS — O039 Complete or unspecified spontaneous abortion without complication: Secondary | ICD-10-CM

## 2018-03-24 DIAGNOSIS — Z3009 Encounter for other general counseling and advice on contraception: Secondary | ICD-10-CM

## 2018-03-24 MED ORDER — NORETHIN ACE-ETH ESTRAD-FE 1-20 MG-MCG PO TABS: 1.0000 | ORAL_TABLET | Freq: Every day | ORAL | 6 refills | Status: DC

## 2018-03-24 NOTE — Patient Instructions (Signed)
Oral Contraception Use  Oral contraceptive pills (OCPs) are medicines that you take to prevent pregnancy. OCPs work by:  · Preventing the ovaries from releasing eggs.  · Thickening mucus in the lower part of the uterus (cervix), which prevents sperm from entering the uterus.  · Thinning the lining of the uterus (endometrium), which prevents a fertilized egg from attaching to the endometrium.  OCPs are highly effective when taken exactly as prescribed. However, OCPs do not prevent sexually transmitted infections (STIs). Safe sex practices, such as using condoms while on an OCP, can help prevent STIs.  Before taking OCPs, you may have a physical exam, blood test, and Pap test. A Pap test involves taking a sample of cells from your cervix to check for cancer. Discuss with your health care provider the possible side effects of the OCP you may be prescribed. When you start an OCP, be aware that it can take 2-3 months for your body to adjust to changes in hormone levels.  How to take oral contraceptive pills  Follow instructions from your health care provider about how to start taking your first cycle of OCPs. Your health care provider may recommend that you:  · Start the pill on day 1 of your menstrual period. If you start at this time, you will not need any backup form of birth control (contraception), such as condoms.  · Start the pill on the first Sunday after your menstrual period or on the day you get your prescription. In these cases, you will need to use backup contraception for the first week.  · Start the pill at any time of your cycle.  ? If you take the pill within 5 days of the start of your period, you will not need a backup form of contraception.  ? If you start at any other time of your menstrual cycle, you will need to use another form of contraception for 7 days. If your OCP is the type called a minipill, it will protect you from pregnancy after taking it for 2 days (48 hours), and you can stop using  backup contraception after that time.  After you have started taking OCPs:  · If you forget to take 1 pill, take it as soon as you remember. Take the next pill at the regular time.  · If you miss 2 or more pills, call your health care provider. Different pills have different instructions for missed doses. Use backup birth control until your next menstrual period starts.  · If you use a 28-day pack that contains inactive pills and you miss 1 of the last 7 pills (pills with no hormones), throw away the rest of the non-hormone pills and start a new pill pack.  No matter which day you start the OCP, you will always start a new pack on that same day of the week. Have an extra pack of OCPs and a backup contraceptive method available in case you miss some pills or lose your OCP pack.  Follow these instructions at home:  · Do not use any products that contain nicotine or tobacco, such as cigarettes and e-cigarettes. If you need help quitting, ask your health care provider.  · Always use a condom to protect against STIs. OCPs do not protect against STIs.  · Use a calendar to mark the days of your menstrual period.  · Read the information and directions that came with your OCP. Talk to your health care provider if you have questions.  Contact a   health care provider if:  · You develop nausea and vomiting.  · You have abnormal vaginal discharge or bleeding.  · You develop a rash.  · You miss your menstrual period. Depending on the type of OCP you are taking, this may be a sign of pregnancy. Ask your health care provider for more information.  · You are losing your hair.  · You need treatment for mood swings or depression.  · You get dizzy when taking the OCP.  · You develop acne after taking the OCP.  · You become pregnant or think you may be pregnant.  · You have diarrhea, constipation, and abdominal pain or cramps.  · You miss 2 or more pills.  Get help right away if:  · You develop chest pain.  · You develop shortness of  breath.  · You have an uncontrolled or severe headache.  · You develop numbness or slurred speech.  · You develop visual or speech problems.  · You develop pain, redness, and swelling in your legs.  · You develop weakness or numbness in your arms or legs.  Summary  · Oral contraceptive pills (OCPs) are medicines that you take to prevent pregnancy.  · OCPs do not prevent sexually transmitted infections (STIs). Always use a condom to protect against STIs.  · When you start an OCP, be aware that it can take 2-3 months for your body to adjust to changes in hormone levels.  · Read all the information and directions that come with your OCP.  This information is not intended to replace advice given to you by your health care provider. Make sure you discuss any questions you have with your health care provider.  Document Released: 01/14/2011 Document Revised: 03/08/2016 Document Reviewed: 03/08/2016  Elsevier Interactive Patient Education © 2019 [employer]

## 2018-03-24 NOTE — Progress Notes (Signed)
GYNECOLOGY OFFICE VISIT NOTE  History:  28 y.o. [id number] here today for follow-up of miscarriage, seen about 2 weeks ago and treated with home vaginal cytotec. Doing well, bleeding stopped yesterday. Occasionally felt dizziness and lightheadedness in beginning but denies any now. States some nausea in the beginning after taking medication, none now. No diarrhea/constipation.   Wondering what to do about pH balance. Tends to get BV right after periods. Will get treated but always comes back. Not much itching or pain, eventually goes away on own.   The following portions of the patient's history were reviewed and updated as appropriate: allergies, current medications, past family history, past medical history, past social history, past surgical history and problem list.   Review of Systems:  Pertinent items noted in HPI ROS  Objective:  Physical Exam BP 114/87   Pulse 74   Ht 5\' 4"  (1.626 m)   Wt 197 lb 6.4 oz (89.5 kg)   LMP 01/20/2018 (Approximate) Comment: SAB  Breastfeeding Unknown   BMI 33.88 kg/m  Physical Exam  Constitutional: She is oriented to person, place, and time. She appears well-developed and well-nourished. No distress.  HENT:  Head: Normocephalic and atraumatic.  Eyes: Conjunctivae and EOM are normal. No scleral icterus.  Cardiovascular: Normal rate.  Pulmonary/Chest: No respiratory distress.  Genitourinary:    Genitourinary Comments: deferred   Neurological: She is alert and oriented to person, place, and time.  Psychiatric: She has a normal mood and affect. Her behavior is normal.  Nursing note and vitals reviewed.   Labs and Imaging Results for orders placed or performed in visit on 03/17/18 (from the past 168 hour(s))  Beta hCG quant (ref lab)   Collection Time: 03/17/18 11:43 AM  Result Value Ref Range   hCG Quant 1,344 mIU/mL   US Ob Transvaginal  Result Date: 03/10/2018 CLINICAL DATA:  Pregnant, inevitable abortion EXAM: TRANSVAGINAL OB  ULTRASOUND TECHNIQUE: Transvaginal ultrasound was performed for complete evaluation of the gestation as well as the maternal uterus, adnexal regions, and pelvic cul-de-sac. COMPARISON:  02/23/2018 FINDINGS: Intrauterine gestational sac: None visualized; previously identified gestational sac no longer seen Yolk sac:  N/A Embryo:  N/A Cardiac Activity: N/A Heart Rate: N/A bpm MSD:   mm    w     d CRL:     mm    w  d                  [place] EDC: Subchorionic hemorrhage:  N/A Maternal uterus/adnexae: Normal uterine morphology without mass Endometrial complex contains a focal area of heterogeneity at the mid uterine segment 10 x 20 x 17 mm question blood versus retained products of conception; no blood flow attended 5 within this region on color Doppler imaging. Normal appearing ovaries bilaterally. No free pelvic fluid or adnexal masses. IMPRESSION: Spontaneous abortion, with previously identified intrauterine gestational sac no longer visualized. Focal area of heterogeneous echogenicity within the endometrial complex at the mid uterine segment measuring 10 x 20 x 17 mm question blood/clot versus retained products of conception within endometrial canal. Electronically Signed   By: Ulyses Southward M.D.   On: 03/10/2018 08:22   US Ob Transvaginal  Result Date: 02/23/2018 CLINICAL DATA:  Bleeding in early pregnancy; EGA [redacted] weeks 6 days by first ultrasound EXAM: TRANSVAGINAL OB ULTRASOUND TECHNIQUE: Transvaginal ultrasound was performed for complete evaluation of the gestation as well as the maternal uterus, adnexal regions, and pelvic cul-de-sac. COMPARISON:  02/17/2018 FINDINGS: Intrauterine gestational sac: Present, single, slightly  irregular Yolk sac:  Present Embryo:  Present Cardiac Activity: Present Heart Rate: 45 bpm MSD: 10.9 mm   5 w   5 d CRL:   2.2 mm   5 w 5 d                  [place] EDC: 10/21/2018 Subchorionic hemorrhage:  Small subchronic hemorrhage Maternal uterus/adnexae: RIGHT ovary normal size and morphology 1.5  x 4.2 x 1.8 cm. LEFT ovary normal size and morphology, 1.9 x 3.1 x 1.6 cm. Trace free pelvic fluid. No adnexal masses. IMPRESSION: Single live intrauterine gestation with crown-rump length correlating with an EGA of [redacted] weeks 5 days. However, fetal heart rate is low, the gestational sac is irregular, and there has been a lack of expected growth of the fetal pole since the prior study; these are poor prognostic indicators. Electronically Signed   By: Ulyses Southward M.D.   On: 02/23/2018 15:15    Assessment & Plan:  Miscarriage Contraception Counseling  Ms. Coville is a 28yo [id number] who has had a recent miscarriage managed by cytotec who presents for follow-up. Only took one dose of cytotec and had appropriate amount of bleeding. HCG has been declining appropriately, will repeat today.  -- repeat hCG quant in 1 week, will continue until close to 0 -- counseling regarding contraception - opted for OCPs, counseled on use  encouraged back-up method for next 7-10 days -- reviewed use of vaginal hygiene, boric acid suppositories after period, counseled on avoiding use in pregnancy  Routine preventative health maintenance measures emphasized. Please refer to After Visit Summary for other counseling recommendations.   Return in about 1 week (around 03/31/2018) for lab only quant hCG.  Cristal Deer. Earlene Plater, DO OB Family Medicine Fellow, [facility] for [employer], [facility]

## 2018-03-25 LAB — BETA HCG QUANT (REF LAB): HCG QUANT: 466 m[IU]/mL

## 2018-03-31 ENCOUNTER — Telehealth: Payer: Self-pay | Admitting: Family Medicine

## 2018-03-31 ENCOUNTER — Other Ambulatory Visit: Payer: Self-pay

## 2018-03-31 DIAGNOSIS — O039 Complete or unspecified spontaneous abortion without complication: Secondary | ICD-10-CM

## 2018-03-31 NOTE — Telephone Encounter (Signed)
Attempted to call patient to let her know that she could come in today until 11:30 to get her lab work. If can not make it give [place] a call to get rescheduled. No answer, LVM to give [place] a call back

## 2018-04-01 LAB — BETA HCG QUANT (REF LAB): hCG Quant: 139 m[IU]/mL

## 2018-08-14 ENCOUNTER — Emergency Department (HOSPITAL_COMMUNITY)
Admission: EM | Admit: 2018-08-14 | Discharge: 2018-08-14 | Disposition: A | Discharge status: Home / Self Care | Payer: HRSA Program | Attending: Emergency Medicine | Admitting: Emergency Medicine

## 2018-08-14 ENCOUNTER — Other Ambulatory Visit: Payer: Self-pay

## 2018-08-14 ENCOUNTER — Encounter (HOSPITAL_COMMUNITY): Payer: Self-pay | Admitting: Emergency Medicine

## 2018-08-14 DIAGNOSIS — Z79899 Other long term (current) drug therapy: Secondary | ICD-10-CM | POA: Diagnosis not present

## 2018-08-14 DIAGNOSIS — Z20828 Contact with and (suspected) exposure to other viral communicable diseases: Secondary | ICD-10-CM | POA: Diagnosis not present

## 2018-08-14 DIAGNOSIS — R05 Cough: Secondary | ICD-10-CM | POA: Diagnosis not present

## 2018-08-14 DIAGNOSIS — M7918 Myalgia, other site: Secondary | ICD-10-CM | POA: Diagnosis present

## 2018-08-14 DIAGNOSIS — Z20822 Contact with and (suspected) exposure to covid-19: Secondary | ICD-10-CM

## 2018-08-14 NOTE — Discharge Instructions (Addendum)
Isolate yourself and family members for the next 14 days or until symptoms resolve and cover test negative. Return to the emergency room for increased work of breathing or new concerns. Take Tylenol and Motrin as needed for fevers. Have blood pressure rechecked by primary doctor next week.

## 2018-08-14 NOTE — ED Provider Notes (Addendum)
[facility] EMERGENCY DEPARTMENT Provider Note   CSN: [id number] Arrival date & time: 08/14/18  1120    History   Chief Complaint Chief Complaint  Patient presents with  . Cough  . sneeze  . Chills    HPI Tanya Chapman is a 28 y.o. female.     Patient with history of thyroid disease, currently works at a gas station, no other known sick contacts presents with body aches nasal congestion cough and chills for over 2 days.  Daughters now have similar symptoms.  No shortness of breath or increased work of breathing.  Intermittent subjective fever.     Past Medical History:  Diagnosis Date  . Fatigue   . Generalized headaches    migraines and generalized   . Hyperthyroidism   . MRSA carrier   . Thyroid disease    hyperthyroid   . Trouble swallowing   . Visual disturbance    eyes bulging, blurry, and hurting   . Weight loss, unintentional     Patient Active Problem List   Diagnosis Date Noted  . Dysmenorrhea 10/22/2011  . Menorrhagia 10/22/2011  . Thyroid goiter 11/03/2010  . Grave's disease 11/03/2010    Past Surgical History:  Procedure Laterality Date  . CESAREAN SECTION  11/22/08  . HERNIA REPAIR  1996  . nexplanon  01/23/2015   Nexplanon removed  . TOTAL THYROIDECTOMY  11/26/10     OB History    Gravida  7   Para  2   Term  2   Preterm  0   AB  4   Living  2     SAB  0   TAB  4   Ectopic  0   Multiple  0   Live Births  2            Home Medications    Prior to Admission medications   Medication Sig Start Date End Date Taking? Authorizing Provider  ferrous sulfate 325 (65 FE) MG tablet Take 1 tablet (325 mg total) by mouth daily. Patient taking differently: Take 325 mg by mouth 4 (four) times daily.  08/03/16   Luvenia Redden, PA-C  levothyroxine (SYNTHROID, LEVOTHROID) 175 MCG tablet Take 175 mcg by mouth daily. Takes with 50 mcg tab daily 05/11/16   [provider]  norethindrone-ethinyl  estradiol (JUNEL FE,GILDESS FE,LOESTRIN FE) 1-20 MG-MCG tablet Take 1 tablet by mouth daily. 03/24/18   Glenice Bow, DO    Family History Family History  Problem Relation Age of Onset  . Migraines Mother   . Migraines Brother   . Diabetes Maternal Grandmother   . Hypertension Maternal Grandmother   . Hypertension Maternal Grandfather   . Stroke Maternal Grandfather   . Breast cancer Paternal Grandmother   . Diabetes Paternal Grandmother   . Thyroid disease Paternal Grandmother   . Hypertension Paternal Grandfather     Social History Social History   Tobacco Use  . Smoking status: Never Smoker  . Smokeless tobacco: Never Used  Substance Use Topics  . Alcohol use: Not Currently    Alcohol/week: 0.0 standard drinks    Comment: socially  . Drug use: No     Allergies   Doxycycline and Provera [medroxyprogesterone acetate]   Review of Systems Review of Systems  Constitutional: Positive for chills and fever.  HENT: Positive for congestion.   Respiratory: Positive for cough. Negative for shortness of breath.   Cardiovascular: Negative for chest pain.  Gastrointestinal: Negative  for abdominal pain and vomiting.  Genitourinary: Negative for dysuria and flank pain.  Musculoskeletal: Negative for back pain, neck pain and neck stiffness.  Skin: Negative for rash.  Neurological: Negative for light-headedness and headaches.     Physical Exam Updated Vital Signs BP (!) 152/98 (BP Location: Right Arm)   Pulse 87   Temp 98.5 F (36.9 C) (Oral)   Resp 16   LMP 01/20/2018 (Approximate)   SpO2 100%   Physical Exam Vitals signs and nursing note reviewed.  Constitutional:      Appearance: She is well-developed.  HENT:     Head: Normocephalic and atraumatic.     Nose: Congestion present.  Eyes:     General:        Right eye: No discharge.        Left eye: No discharge.     Conjunctiva/sclera: Conjunctivae normal.  Neck:     Musculoskeletal: Normal range of  motion.     Trachea: No tracheal deviation.  Cardiovascular:     Rate and Rhythm: Normal rate.  Pulmonary:     Effort: Pulmonary effort is normal.     Breath sounds: Normal breath sounds.  Abdominal:     General: There is no distension.     Palpations: Abdomen is soft.     Tenderness: There is no abdominal tenderness. There is no guarding.  Skin:    General: Skin is warm.  Neurological:     Mental Status: She is alert and oriented to person, place, and time.      ED Treatments / Results  Labs (all labs ordered are listed, but only abnormal results are displayed) Labs Reviewed  NOVEL CORONAVIRUS, NAA (HOSPITAL ORDER, SEND-OUT TO REF LAB)    EKG None  Radiology No results found.  Procedures Procedures (including critical care time)  Medications Ordered in ED Medications - No data to display   Initial Impression / Assessment and Plan / ED Course  I have reviewed the triage vital signs and the nursing notes.  Pertinent labs & imaging results that were available during my care of the patient were reviewed by me and considered in my medical decision making (see chart for details).       Patient presents with viral-like symptoms concerning for COVID.  Lungs are clear, vital signs unremarkable, normal work of breathing.  Sent outpatient COVID test, will give work note and discussed follow-up and reasons to return.  Judd Gaudierameisha S Capo was evaluated in Emergency Department on 08/14/2018 for the symptoms described in the history of present illness. She was evaluated in the context of the global COVID-19 pandemic, which necessitated consideration that the patient might be at risk for infection with the SARS-CoV-2 virus that causes COVID-19. Institutional protocols and algorithms that pertain to the evaluation of patients at risk for COVID-19 are in a state of rapid change based on information released by regulatory bodies including the CDC and federal and state organizations.  These policies and algorithms were followed during the patient's care in the ED.   Final Clinical Impressions(s) / ED Diagnoses   Final diagnoses:  Suspected Covid-19 Virus Infection  COugh  ED Discharge Orders    None       Blane OharaZavitz, Treazure Nery, MD 08/14/18 1157    Blane OharaZavitz, Johncharles Fusselman, MD 08/14/18 1306

## 2018-08-14 NOTE — ED Notes (Signed)
Pt given sprite to drink. 

## 2018-08-14 NOTE — ED Triage Notes (Signed)
Mom has has cough, nasal congestion and body aches and chills x3 days. No known sick contacts. Lungs CTA. Tylenol at 0600.

## 2018-08-16 LAB — NOVEL CORONAVIRUS, NAA (HOSP ORDER, SEND-OUT TO REF LAB; TAT 18-24 HRS): SARS-CoV-2, NAA: NOT DETECTED

## 2018-08-29 ENCOUNTER — Other Ambulatory Visit: Payer: Self-pay

## 2018-08-31 ENCOUNTER — Other Ambulatory Visit: Payer: Self-pay

## 2018-08-31 ENCOUNTER — Ambulatory Visit: Payer: Self-pay | Admitting: Endocrinology

## 2018-08-31 ENCOUNTER — Encounter: Payer: Self-pay | Admitting: Endocrinology

## 2018-08-31 VITALS — BP 128/70 | HR 67 | Ht 64.0 in | Wt 207.6 lb

## 2018-08-31 DIAGNOSIS — E039 Hypothyroidism, unspecified: Secondary | ICD-10-CM | POA: Insufficient documentation

## 2018-08-31 DIAGNOSIS — E890 Postprocedural hypothyroidism: Secondary | ICD-10-CM

## 2018-08-31 DIAGNOSIS — N924 Excessive bleeding in the premenopausal period: Secondary | ICD-10-CM

## 2018-08-31 MED ORDER — LEVOTHYROXINE SODIUM 175 MCG PO TABS: 175.0000 ug | ORAL_TABLET | Freq: Every day | ORAL | 3 refills | Status: DC

## 2018-08-31 NOTE — Patient Instructions (Addendum)
I have sent a prescription to your pharmacy, to refill the medication.  In view of your medical condition, you should avoid pregnancy until we have decided it is safe.  Please come back for a follow-up appointment in 1 year.

## 2018-08-31 NOTE — Progress Notes (Signed)
Subjective:    Patient ID: Tanya Chapman, female    DOB: 11/07/1990, 28 y.o.   MRN: [id number]  HPI Pt is referred by Princella PellegriniJustin Reed, PA, for hypothyroidism.  Pt had thyroidectomy in 2012, for Grave's Dz.  she has been on prescribed thyroid hormone therapy since then.  she has never taken kelp or any other type of non-prescribed thyroid product. She is not considering another pregnancy.  she has never had XRT to the neck.  she has never been on amiodarone or lithium. She was seen at [facility] 3 mos ago, and found to have severely elevated TSH, off rx.  she was only given 30 days rx, so she has run out.  She has slight cramping of the hands, and assoc depression.   Past Medical History:  Diagnosis Date  . Fatigue   . Generalized headaches    migraines and generalized   . Hyperthyroidism   . MRSA carrier   . Thyroid disease    hyperthyroid   . Trouble swallowing   . Visual disturbance    eyes bulging, blurry, and hurting   . Weight loss, unintentional     Past Surgical History:  Procedure Laterality Date  . CESAREAN SECTION  11/22/08  . HERNIA REPAIR  1996  . nexplanon  01/23/2015   Nexplanon removed  . TOTAL THYROIDECTOMY  11/26/10    Social History   Socioeconomic History  . Marital status: Single    Spouse name: Not on file  . Number of children: Not on file  . Years of education: Not on file  . Highest education level: Not on file  Occupational History  . Not on file  Social Needs  . Financial resource strain: Not on file  . Food insecurity    Worry: Not on file    Inability: Not on file  . Transportation needs    Medical: Not on file    Non-medical: Not on file  Tobacco Use  . Smoking status: Never Smoker  . Smokeless tobacco: Never Used  Substance and Sexual Activity  . Alcohol use: Not Currently    Alcohol/week: 0.0 standard drinks    Comment: socially  . Drug use: No  . Sexual activity: Yes    Birth control/protection: None  Lifestyle  . Physical activity     Days per week: Not on file    Minutes per session: Not on file  . Stress: Not on file  Relationships  . Social Musicianconnections    Talks on phone: Not on file    Gets together: Not on file    Attends religious service: Not on file    Active member of club or organization: Not on file    Attends meetings of clubs or organizations: Not on file    Relationship status: Not on file  . Intimate partner violence    Fear of current or ex partner: Not on file    Emotionally abused: Not on file    Physically abused: Not on file    Forced sexual activity: Not on file  Other Topics Concern  . Not on file  Social History Narrative  . Not on file    Current Outpatient Medications on File Prior to Visit  Medication Sig Dispense Refill  . ferrous sulfate 325 (65 FE) MG tablet Take 1 tablet (325 mg total) by mouth daily. (Patient taking differently: Take 325 mg by mouth 4 (four) times daily. ) 30 tablet 0  . norethindrone-ethinyl estradiol (JUNEL FE,GILDESS  FE,LOESTRIN FE) 1-20 MG-MCG tablet Take 1 tablet by mouth daily. 3 Package 6   No current facility-administered medications on file prior to visit.     Allergies  Allergen Reactions  . Doxycycline Rash  . Provera [Medroxyprogesterone Acetate] Rash    Family History  Problem Relation Age of Onset  . Migraines Mother   . Migraines Brother   . Diabetes Maternal Grandmother   . Hypertension Maternal Grandmother   . Hypertension Maternal Grandfather   . Stroke Maternal Grandfather   . Breast cancer Paternal Grandmother   . Diabetes Paternal Grandmother   . Thyroid disease Paternal Grandmother   . Hypertension Paternal Grandfather     BP 128/70 (BP Location: Left Arm, Patient Position: Sitting, Cuff Size: Normal)   Pulse 67   Ht 5\' 4"  (1.626 m)   Wt 207 lb 9.6 oz (94.2 kg)   LMP 01/20/2018 (Approximate)   SpO2 97%   BMI 35.63 kg/m    denies sob, weight gain, blurry vision, rhinorrhea, and syncope.  She has fatigue, weight gain,  hair loss,difficulty with concentration, constipation, myalgias, dry skin, easy bruising, cold intolerance, numbness, and anxiety.      Objective:   Physical Exam VS: see vs page GEN: no distress HEAD: head: no deformity eyes: no periorbital swelling, no proptosis external nose and ears are normal. NECK: a healed scar is present.  I do not appreciate a nodule in the thyroid or elsewhere in the neck.   CHEST WALL: no deformity.  LUNGS: clear to auscultation.  CV: reg rate and rhythm, no murmur ABD: abdomen is soft, nontender.  no hepatosplenomegaly.  not distended.  no hernia MUSCULOSKELETAL: muscle bulk and strength are grossly normal.  no obvious joint swelling.  gait is normal and steady EXTEMITIES: no deformity.  no ulcer on the feet.  feet are of normal color and temp.  Trace bilat leg edema PULSES: dorsalis pedis intact bilat.  no carotid bruit NEURO:  cn 2-12 grossly intact.   readily moves all 4's.  sensation is intact to touch on the feet SKIN:  Normal texture and temperature.  No rash or suspicious lesion is visible.   NODES:  None palpable at the neck PSYCH: alert, well-oriented.  Does not appear anxious nor depressed.   outside test results are reviewed: TSH (06/02/18)=98  I have reviewed outside records, and summarized:  Pt was noted to have abnormal TFT.  She was given rx for 1 month of synthroid, and ref here.       Assessment & Plan:  Postsurgical hypothyroidism, new to me.  She should resume rx.  Cramps: we discussed the possibility of hypocalcemia. Pt says she would like to check this when back on synthroid.    Patient Instructions  I have sent a prescription to your pharmacy, to refill the medication.  In view of your medical condition, you should avoid pregnancy until we have decided it is safe.  Please come back for a follow-up appointment in 1 year.

## 2018-09-29 ENCOUNTER — Other Ambulatory Visit: Payer: Self-pay

## 2018-10-30 ENCOUNTER — Encounter: Payer: Self-pay | Admitting: Gynecology

## 2019-08-31 ENCOUNTER — Ambulatory Visit (INDEPENDENT_AMBULATORY_CARE_PROVIDER_SITE_OTHER): Payer: Self-pay | Admitting: Endocrinology

## 2019-08-31 ENCOUNTER — Encounter: Payer: Self-pay | Admitting: Endocrinology

## 2019-08-31 ENCOUNTER — Other Ambulatory Visit: Payer: Self-pay

## 2019-08-31 VITALS — BP 120/98 | HR 68 | Ht 64.0 in | Wt 203.6 lb

## 2019-08-31 DIAGNOSIS — N924 Excessive bleeding in the premenopausal period: Secondary | ICD-10-CM

## 2019-08-31 DIAGNOSIS — E890 Postprocedural hypothyroidism: Secondary | ICD-10-CM

## 2019-08-31 LAB — BASIC METABOLIC PANEL
BUN: 8 mg/dL (ref 6–23)
CO2: 28 mEq/L (ref 19–32)
Calcium: 9.2 mg/dL (ref 8.4–10.5)
Chloride: 105 mEq/L (ref 96–112)
Creatinine, Ser: 0.98 mg/dL (ref 0.40–1.20)
GFR: 80.88 mL/min (ref >60.00)
Glucose, Bld: 95 mg/dL (ref 70–99)
Potassium: 3.5 mEq/L (ref 3.5–5.1)
Sodium: 138 mEq/L (ref 135–145)

## 2019-08-31 LAB — IBC PANEL
Iron: 119 ug/dL (ref 42–145)
Saturation Ratios: 20 % (ref 20.0–50.0)
Transferrin: 426 mg/dL — ABNORMAL HIGH (ref 212.0–360.0)

## 2019-08-31 LAB — TSH: TSH: 42.26 u[IU]/mL — ABNORMAL HIGH (ref 0.35–4.50)

## 2019-08-31 MED ORDER — LEVOTHYROXINE SODIUM 200 MCG PO TABS: 200.0000 ug | ORAL_TABLET | Freq: Every day | ORAL | 3 refills | Status: DC

## 2019-08-31 NOTE — Progress Notes (Signed)
Subjective:    Patient ID: Tanya Chapman, female    DOB: [birth year], 29 y.o.   MRN: [id number]  HPI Pt returns for f/u of postsurgical hypothyroidism (pt had thyroidectomy in 2012, for Grave's Dz; pt says she is not at risk for pregnancy; therapy has been limited by intermitt med compliance).  pt states she feels well in general, except for fatigue.   Past Medical History:  Diagnosis Date  . Fatigue   . Generalized headaches    migraines and generalized   . Hyperthyroidism   . MRSA carrier   . Thyroid disease    hyperthyroid   . Trouble swallowing   . Visual disturbance    eyes bulging, blurry, and hurting   . Weight loss, unintentional     Past Surgical History:  Procedure Laterality Date  . CESAREAN SECTION  11/22/08  . HERNIA REPAIR  1996  . nexplanon  01/23/2015   Nexplanon removed  . TOTAL THYROIDECTOMY  11/26/10    Social History   Socioeconomic History  . Marital status: Single    Spouse name: Not on file  . Number of children: Not on file  . Years of education: Not on file  . Highest education level: Not on file  Occupational History  . Not on file  Tobacco Use  . Smoking status: Never Smoker  . Smokeless tobacco: Never Used  Vaping Use  . Vaping Use: Never used  Substance and Sexual Activity  . Alcohol use: Not Currently    Alcohol/week: 0.0 standard drinks    Comment: socially  . Drug use: No  . Sexual activity: Yes    Birth control/protection: None  Other Topics Concern  . Not on file  Social History Narrative  . Not on file   Social Determinants of Health   Financial Resource Strain:   . Difficulty of Paying Living Expenses:   Food Insecurity:   . Worried About Programme researcher, broadcasting/film/video in the Last Year:   . Barista in the Last Year:   Transportation Needs:   . Freight forwarder (Medical):   Marland Kitchen Lack of Transportation (Non-Medical):   Physical Activity:   . Days of Exercise per Week:   . Minutes of Exercise per Session:     Stress:   . Feeling of Stress :   Social Connections:   . Frequency of Communication with Friends and Family:   . Frequency of Social Gatherings with Friends and Family:   . Attends Religious Services:   . Active Member of Clubs or Organizations:   . Attends Banker Meetings:   Marland Kitchen Marital Status:   Intimate Partner Violence:   . Fear of Current or Ex-Partner:   . Emotionally Abused:   Marland Kitchen Physically Abused:   . Sexually Abused:     Current Outpatient Medications on File Prior to Visit  Medication Sig Dispense Refill  . ferrous sulfate 325 (65 FE) MG tablet Take 1 tablet (325 mg total) by mouth daily. (Patient taking differently: Take 325 mg by mouth 4 (four) times daily. ) 30 tablet 0  . norethindrone-ethinyl estradiol (JUNEL FE,GILDESS FE,LOESTRIN FE) 1-20 MG-MCG tablet Take 1 tablet by mouth daily. 3 Package 6   No current facility-administered medications on file prior to visit.    Allergies  Allergen Reactions  . Doxycycline Rash  . Provera [Medroxyprogesterone Acetate] Rash    Family History  Problem Relation Age of Onset  . Migraines Mother   . Migraines  Brother   . Diabetes Maternal Grandmother   . Hypertension Maternal Grandmother   . Hypertension Maternal Grandfather   . Stroke Maternal Grandfather   . Breast cancer Paternal Grandmother   . Diabetes Paternal Grandmother   . Thyroid disease Paternal Grandmother   . Hypertension Paternal Grandfather     BP (!) 120/98   Pulse 68   Ht 5\' 4"  (1.626 m)   Wt (!) 203 lb 9.6 oz (92.4 kg)   BMI 34.95 kg/m    Review of Systems Denies numbness and muscle cramps.      Objective:   Physical Exam VITAL SIGNS:  See vs page GENERAL: no distress NECK: There is no palpable thyroid enlargement.  No thyroid nodule is palpable.  No palpable lymphadenopathy at the anterior neck.   Lab Results  Component Value Date   TSH 42.26 (H) 08/31/2019   T4TOTAL 2.2 (L) 10/26/2012       Assessment & Plan:   Hypothyroidism: she needs increased rx.  I have sent a prescription to your pharmacy, to recheck.  fatigue: check BMET.   I have sent a prescription to your pharmacy, to increase synthroid.  recheck labs at 30d

## 2019-08-31 NOTE — Patient Instructions (Signed)
Blood tests are requested for you today.  We'll let you know about the results.  In view of your medical condition, you should avoid pregnancy until we have decided it is safe.  Please come back for a follow-up appointment in 1 year.

## 2019-10-05 IMAGING — US US OB TRANSVAGINAL
1 series · 15 of 24 positions shown · non-contrast
Comparison: 02/23/2018

CLINICAL DATA: Pregnant, inevitable abortion

EXAM:
TRANSVAGINAL OB ULTRASOUND
TECHNIQUE: Transvaginal ultrasound was performed for complete evaluation of the
gestation as well as the maternal uterus, adnexal regions, and
pelvic cul-de-sac.

[Series 1: us ob transvaginal · 15 of 24 slices shown]
[im 1/24]
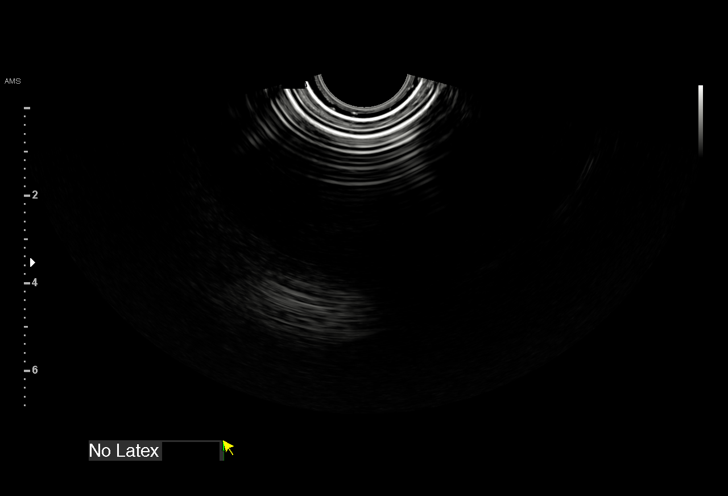
[im 3/24]
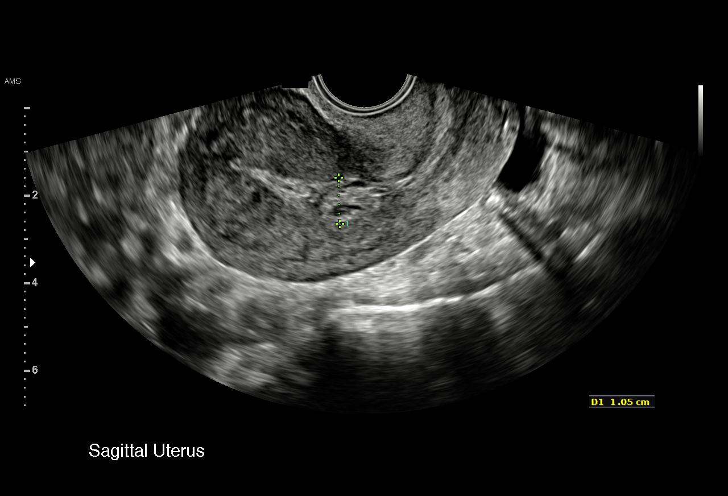
[im 5/24]
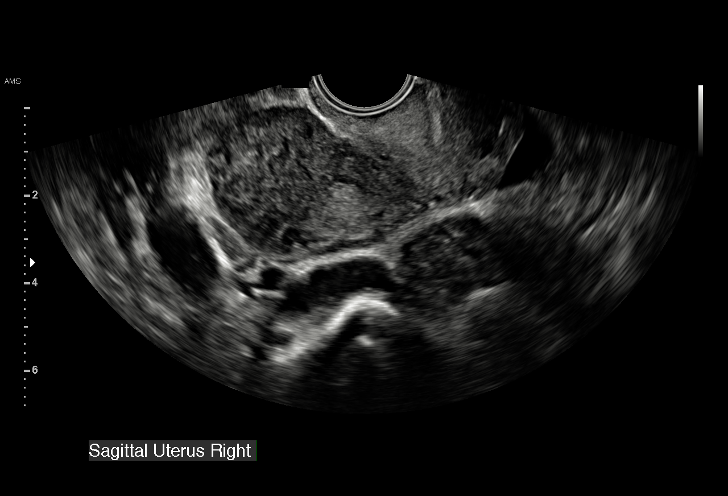
[im 6/24]
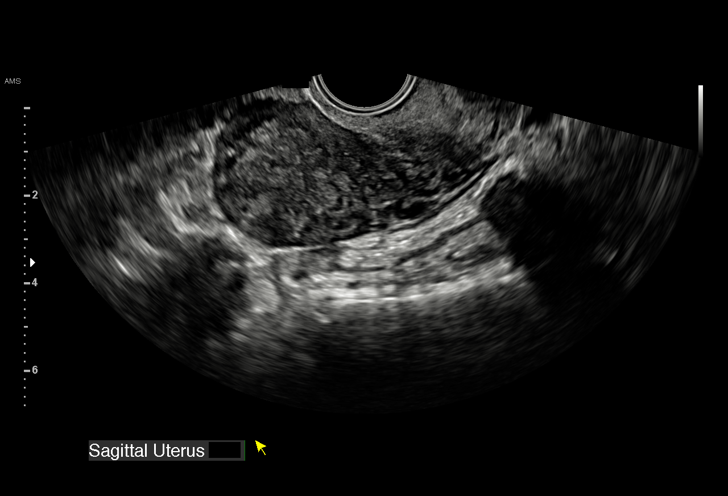
[im 8/24]
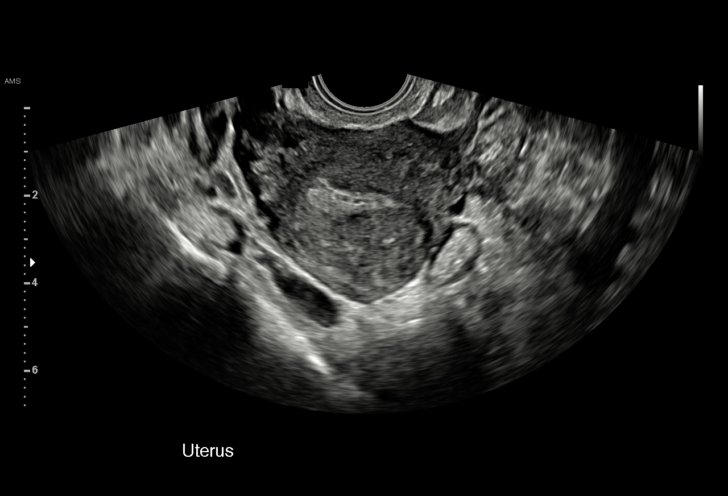
[im 9/24]
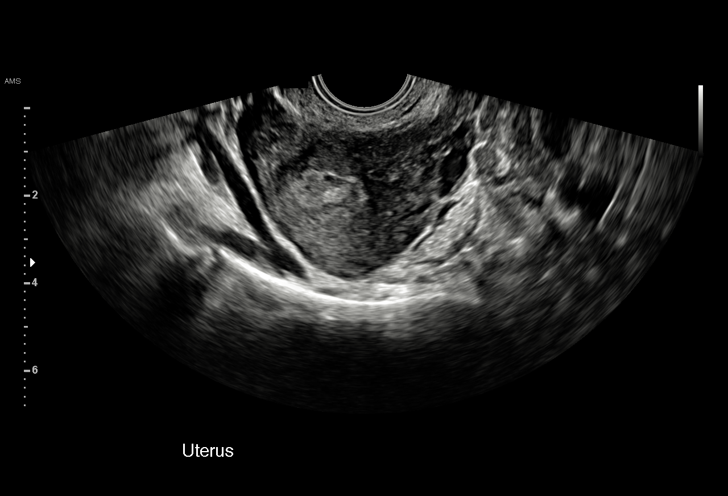
[im 11/24]
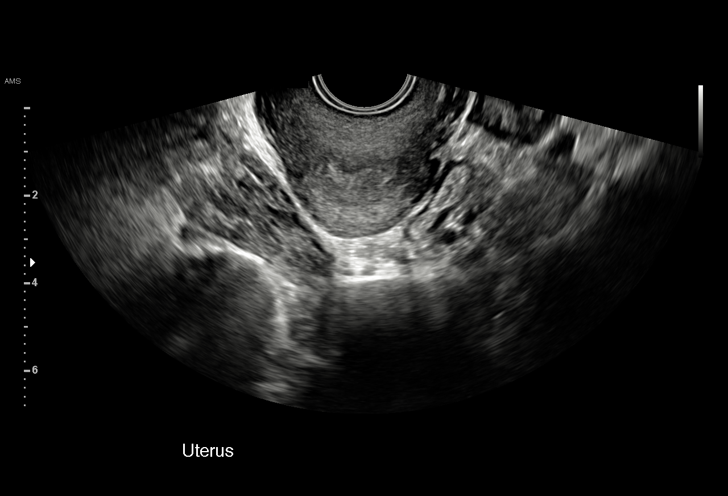
[im 13/24]
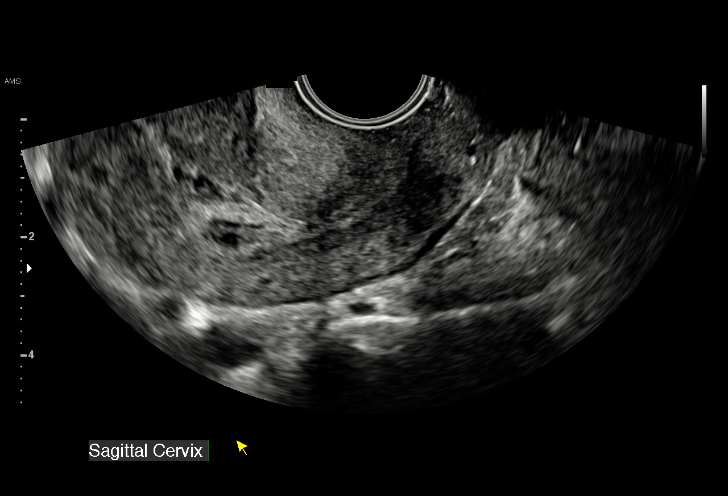
[im 14/24]
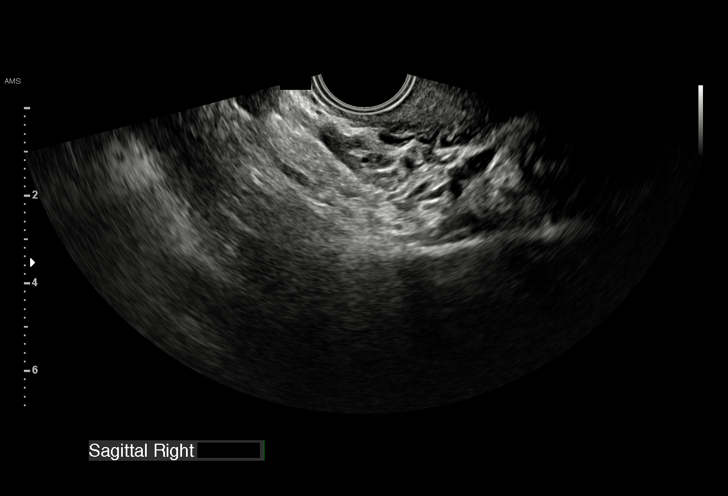
[im 16/24]
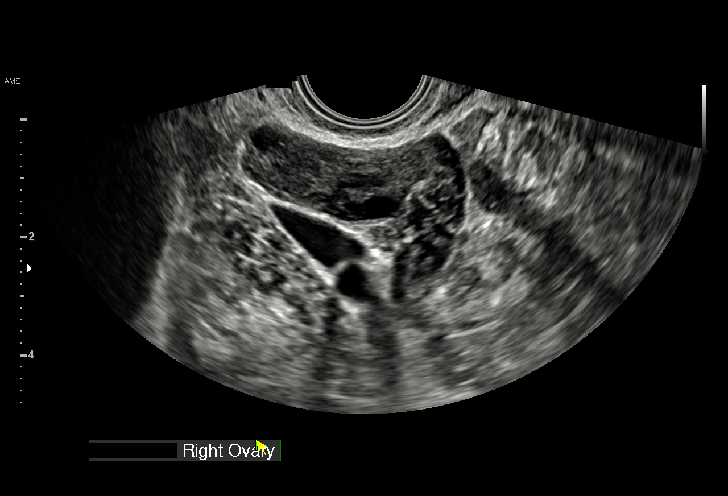
[im 17/24]
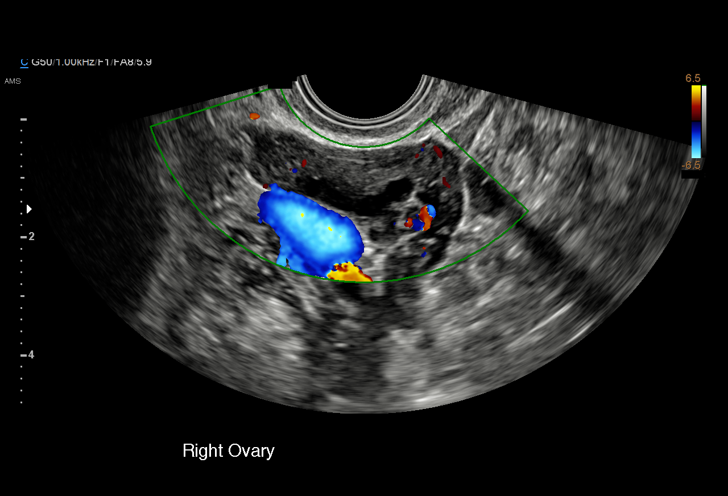
[im 19/24]
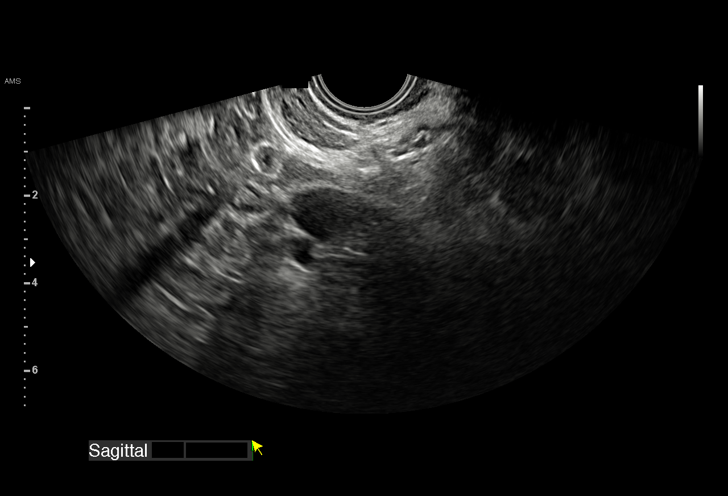
[im 21/24]
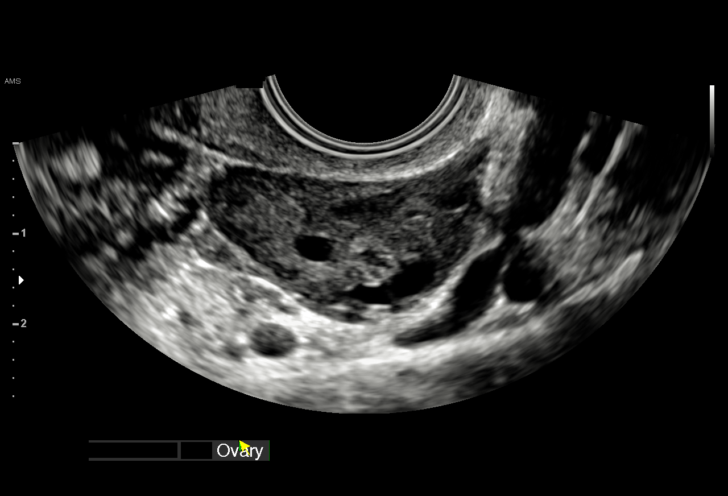
[im 22/24]
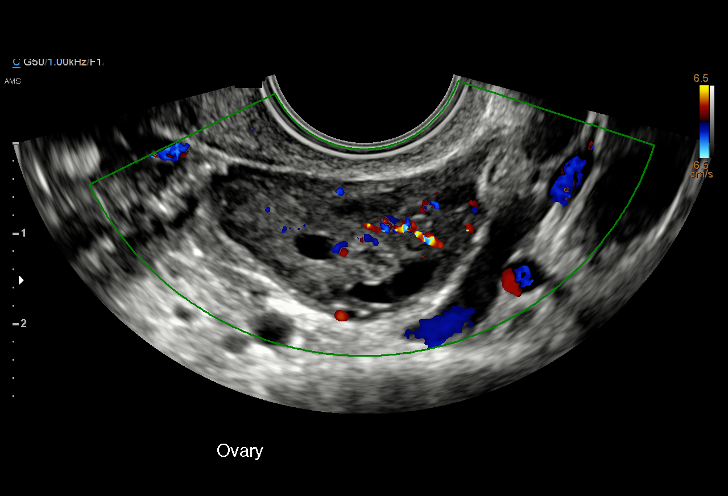
[im 24/24]
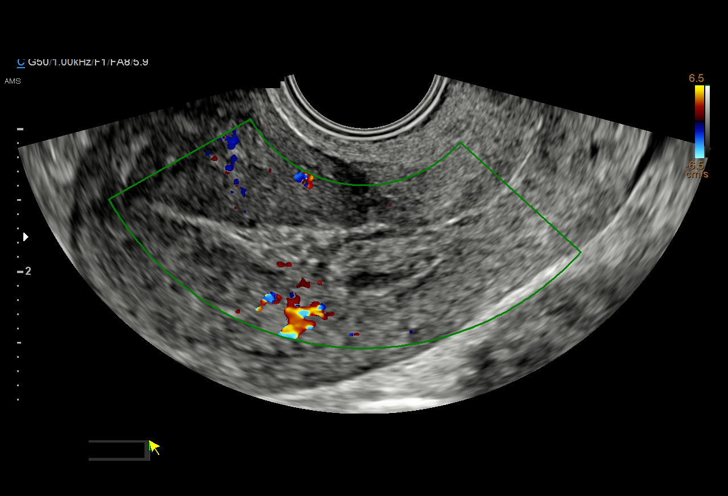

[15 of 24 positions shown; findings below may reference images not displayed]

FINDINGS: Intrauterine gestational sac: None visualized; previously identified
gestational sac no longer seen

Yolk sac:  N/A

Embryo:  N/A

Cardiac Activity: N/A

Heart Rate: N/A bpm

MSD:   mm    w     d

CRL:     mm    w  d                  US EDC:

Subchorionic hemorrhage:  N/A

Maternal uterus/adnexae:

Normal uterine morphology without mass

Endometrial complex contains a focal area of heterogeneity at the
mid uterine segment 10 x 20 x 17 mm question blood versus retained
products of conception; no blood flow attended 5 within this region
on color Doppler imaging.

Normal appearing ovaries bilaterally.

No free pelvic fluid or adnexal masses.
IMPRESSION: Spontaneous abortion, with previously identified intrauterine
gestational sac no longer visualized.

Focal area of heterogeneous echogenicity within the endometrial
complex at the mid uterine segment measuring 10 x 20 x 17 mm
question blood/clot versus retained products of conception within
endometrial canal.

## 2019-10-29 ENCOUNTER — Inpatient Hospital Stay (HOSPITAL_COMMUNITY): Payer: Self-pay

## 2019-10-29 ENCOUNTER — Other Ambulatory Visit: Payer: Self-pay

## 2019-10-29 ENCOUNTER — Inpatient Hospital Stay (HOSPITAL_COMMUNITY)
Admission: AD | Admit: 2019-10-29 | Discharge: 2019-10-29 | Disposition: A | Discharge status: Home / Self Care | Payer: Self-pay | Attending: Obstetrics and Gynecology | Admitting: Obstetrics and Gynecology

## 2019-10-29 ENCOUNTER — Encounter (HOSPITAL_COMMUNITY): Payer: Self-pay | Admitting: Obstetrics and Gynecology

## 2019-10-29 DIAGNOSIS — Z79890 Hormone replacement therapy (postmenopausal): Secondary | ICD-10-CM | POA: Insufficient documentation

## 2019-10-29 DIAGNOSIS — O26899 Other specified pregnancy related conditions, unspecified trimester: Secondary | ICD-10-CM

## 2019-10-29 DIAGNOSIS — B9689 Other specified bacterial agents as the cause of diseases classified elsewhere: Secondary | ICD-10-CM | POA: Insufficient documentation

## 2019-10-29 DIAGNOSIS — N760 Acute vaginitis: Secondary | ICD-10-CM

## 2019-10-29 DIAGNOSIS — Z3A01 Less than 8 weeks gestation of pregnancy: Secondary | ICD-10-CM | POA: Insufficient documentation

## 2019-10-29 DIAGNOSIS — R109 Unspecified abdominal pain: Secondary | ICD-10-CM

## 2019-10-29 DIAGNOSIS — Z3490 Encounter for supervision of normal pregnancy, unspecified, unspecified trimester: Secondary | ICD-10-CM

## 2019-10-29 DIAGNOSIS — O99891 Other specified diseases and conditions complicating pregnancy: Secondary | ICD-10-CM

## 2019-10-29 DIAGNOSIS — O23591 Infection of other part of genital tract in pregnancy, first trimester: Secondary | ICD-10-CM | POA: Insufficient documentation

## 2019-10-29 DIAGNOSIS — Z881 Allergy status to other antibiotic agents status: Secondary | ICD-10-CM | POA: Insufficient documentation

## 2019-10-29 LAB — COMPREHENSIVE METABOLIC PANEL
ALT: 11 U/L (ref 0–44)
AST: 14 U/L — ABNORMAL LOW (ref 15–41)
Albumin: 3.9 g/dL (ref 3.5–5.0)
Alkaline Phosphatase: 44 U/L (ref 38–126)
Anion gap: 9 (ref 5–15)
BUN: 6 mg/dL (ref 6–20)
CO2: 24 mmol/L (ref 22–32)
Calcium: 8.9 mg/dL (ref 8.9–10.3)
Chloride: 103 mmol/L (ref 98–111)
Creatinine, Ser: 0.79 mg/dL (ref 0.44–1.00)
GFR calc Af Amer: >60 mL/min (ref >60)
GFR calc non Af Amer: >60 mL/min (ref >60)
Glucose, Bld: 86 mg/dL (ref 70–99)
Potassium: 3.4 mmol/L — ABNORMAL LOW (ref 3.5–5.1)
Sodium: 136 mmol/L (ref 135–145)
Total Bilirubin: 0.5 mg/dL (ref 0.3–1.2)
Total Protein: 6.8 g/dL (ref 6.5–8.1)

## 2019-10-29 LAB — CBC
HCT: 35.1 % — ABNORMAL LOW (ref 36.0–46.0)
Hemoglobin: 12 g/dL (ref 12.0–15.0)
MCH: 28.1 pg (ref 26.0–34.0)
MCHC: 34.2 g/dL (ref 30.0–36.0)
MCV: 82.2 fL (ref 80.0–100.0)
Platelets: 356 10*3/uL (ref 150–400)
RBC: 4.27 MIL/uL (ref 3.87–5.11)
RDW: 15.6 % — ABNORMAL HIGH (ref 11.5–15.5)
WBC: 7 10*3/uL (ref 4.0–10.5)
nRBC: 0 % (ref 0.0–0.2)

## 2019-10-29 LAB — WET PREP, GENITAL
Sperm: NONE SEEN
Trich, Wet Prep: NONE SEEN
Yeast Wet Prep HPF POC: NONE SEEN

## 2019-10-29 LAB — URINALYSIS, ROUTINE W REFLEX MICROSCOPIC
Bilirubin Urine: NEGATIVE
Glucose, UA: NEGATIVE mg/dL
Hgb urine dipstick: NEGATIVE
Ketones, ur: NEGATIVE mg/dL
Leukocytes,Ua: NEGATIVE
Nitrite: NEGATIVE
Protein, ur: NEGATIVE mg/dL
Specific Gravity, Urine: 1.01 (ref 1.005–1.030)
pH: 7 (ref 5.0–8.0)

## 2019-10-29 LAB — HCG, QUANTITATIVE, PREGNANCY: hCG, Beta Chain, Quant, S: 94577 m[IU]/mL — ABNORMAL HIGH (ref <5)

## 2019-10-29 MED ORDER — METRONIDAZOLE 500 MG PO TABS: 500.0000 mg | ORAL_TABLET | Freq: Two times a day (BID) | ORAL | 0 refills | Status: DC

## 2019-10-29 MED ORDER — PRENATAL ADULT GUMMY/DHA/FA 0.4-25 MG PO CHEW: 1.0000 | CHEWABLE_TABLET | Freq: Every day | ORAL | 0 refills | Status: DC

## 2019-10-29 NOTE — Discharge Instructions (Signed)
First Trimester of Pregnancy  The first trimester of pregnancy is from week 1 until the end of week 13 (months 1 through 3). During this time, your baby will begin to develop inside you. At 6-8 weeks, the eyes and face are formed, and the heartbeat can be seen on ultrasound. At the end of 12 weeks, all the baby's organs are formed. Prenatal care is all the medical care you receive before the birth of your baby. Make sure you get good prenatal care and follow all of your doctor's instructions. Follow these instructions at home: Medicines  Take over-the-counter and prescription medicines only as told by your doctor. Some medicines are safe and some medicines are not safe during pregnancy.  Take a prenatal vitamin that contains at least 600 micrograms (mcg) of folic acid.  If you have trouble pooping (constipation), take medicine that will make your stool soft (stool softener) if your doctor approves. Eating and drinking   Eat regular, healthy meals.  Your doctor will tell you the amount of weight gain that is right for you.  Avoid raw meat and uncooked cheese.  If you feel sick to your stomach (nauseous) or throw up (vomit): ? Eat 4 or 5 small meals a day instead of 3 large meals. ? Try eating a few soda crackers. ? Drink liquids between meals instead of during meals.  To prevent constipation: ? Eat foods that are high in fiber, like fresh fruits and vegetables, whole grains, and beans. ? Drink enough fluids to keep your pee (urine) clear or pale yellow. Activity  Exercise only as told by your doctor. Stop exercising if you have cramps or pain in your lower belly (abdomen) or low back.  Do not exercise if it is too hot, too humid, or if you are in a place of great height (high altitude).  Try to avoid standing for long periods of time. Move your legs often if you must stand in one place for a long time.  Avoid heavy lifting.  Wear low-heeled shoes. Sit and stand up  straight.  You can have sex unless your doctor tells you not to. Relieving pain and discomfort  Wear a good support bra if your breasts are sore.  Take warm water baths (sitz baths) to soothe pain or discomfort caused by hemorrhoids. Use hemorrhoid cream if your doctor says it is okay.  Rest with your legs raised if you have leg cramps or low back pain.  If you have puffy, bulging veins (varicose veins) in your legs: ? Wear support hose or compression stockings as told by your doctor. ? Raise (elevate) your feet for 15 minutes, 3-4 times a day. ? Limit salt in your food. Prenatal care  Schedule your prenatal visits by the twelfth week of pregnancy.  Write down your questions. Take them to your prenatal visits.  Keep all your prenatal visits as told by your doctor. This is important. Safety  Wear your seat belt at all times when driving.  Make a list of emergency phone numbers. The list should include numbers for family, friends, the hospital, and police and fire departments. General instructions  Ask your doctor for a referral to a local prenatal class. Begin classes no later than at the start of month 6 of your pregnancy.  Ask for help if you need counseling or if you need help with nutrition. Your doctor can give you advice or tell you where to go for help.  Do not use hot tubs, steam   rooms, or saunas.  Do not douche or use tampons or scented sanitary pads.  Do not cross your legs for long periods of time.  Avoid all herbs and alcohol. Avoid drugs that are not approved by your doctor.  Do not use any tobacco products, including cigarettes, chewing tobacco, and electronic cigarettes. If you need help quitting, ask your doctor. You may get counseling or other support to help you quit.  Avoid cat litter boxes and soil used by cats. These carry germs that can cause birth defects in the baby and can cause a loss of your baby (miscarriage) or stillbirth.  Visit your dentist.  At home, brush your teeth with a soft toothbrush. Be gentle when you floss. Contact a doctor if:  You are dizzy.  You have mild cramps or pressure in your lower belly.  You have a nagging pain in your belly area.  You continue to feel sick to your stomach, you throw up, or you have watery poop (diarrhea).  You have a bad smelling fluid coming from your vagina.  You have pain when you pee (urinate).  You have increased puffiness (swelling) in your face, hands, legs, or ankles. Get help right away if:  You have a fever.  You are leaking fluid from your vagina.  You have spotting or bleeding from your vagina.  You have very bad belly cramping or pain.  You gain or lose weight rapidly.  You throw up blood. It may look like coffee grounds.  You are around people who have German measles, fifth disease, or chickenpox.  You have a very bad headache.  You have shortness of breath.  You have any kind of trauma, such as from a fall or a car accident. Summary  The first trimester of pregnancy is from week 1 until the end of week 13 (months 1 through 3).  To take care of yourself and your unborn baby, you will need to eat healthy meals, take medicines only if your doctor tells you to do so, and do activities that are safe for you and your baby.  Keep all follow-up visits as told by your doctor. This is important as your doctor will have to ensure that your baby is healthy and growing well. This information is not intended to replace advice given to you by your health care provider. Make sure you discuss any questions you have with your health care provider. Document Revised: 05/18/2018 Document Reviewed: 02/03/2016 Elsevier Patient Education  2020 [employer]  Safe Medications in Pregnancy   Acne: Benzoyl Peroxide Salicylic Acid  Backache/Headache: Tylenol: 2 regular strength every 4 hours OR              2 Extra strength every 6  hours  Colds/Coughs/Allergies: Benadryl (alcohol free) 25 mg every 6 hours as needed Breath right strips Claritin Cepacol throat lozenges Chloraseptic throat spray Cold-Eeze- up to three times per day Cough drops, alcohol free Flonase (by prescription only) Guaifenesin Mucinex Robitussin DM (plain only, alcohol free) Saline nasal spray/drops Sudafed (pseudoephedrine) & Actifed ** use only after [redacted] weeks gestation and if you do not have high blood pressure Tylenol Vicks Vaporub Zinc lozenges Zyrtec   Constipation: Colace Ducolax suppositories Fleet enema Glycerin suppositories Metamucil Milk of magnesia Miralax Senokot Smooth move tea  Diarrhea: Kaopectate Imodium A-D  *NO pepto Bismol  Hemorrhoids: Anusol Anusol HC Preparation H Tucks  Indigestion: Tums Maalox Mylanta Zantac  Pepcid  Insomnia: Benadryl (alcohol free) 25mg every 6 hours as needed   Tylenol PM Unisom, no Gelcaps  Leg Cramps: Tums MagGel  Nausea/Vomiting:  Bonine Dramamine Emetrol Ginger extract Sea bands Meclizine  Nausea medication to take during pregnancy:  Unisom (doxylamine succinate 25 mg tablets) Take one tablet daily at bedtime. If symptoms are not adequately controlled, the dose can be increased to a maximum recommended dose of two tablets daily (1/2 tablet in the morning, 1/2 tablet mid-afternoon and one at bedtime). Vitamin B6 100mg tablets. Take one tablet twice a day (up to 200 mg per day).  Skin Rashes: Aveeno products Benadryl cream or 25mg every 6 hours as needed Calamine Lotion 1% cortisone cream  Yeast infection: Gyne-lotrimin 7 Monistat 7   **If taking multiple medications, please check labels to avoid duplicating the same active ingredients **take medication as directed on the label ** Do not exceed 4000 mg of tylenol in 24 hours **Do not take medications that contain aspirin or ibuprofen   

## 2019-10-29 NOTE — MAU Note (Signed)
Been having abd pain and really bad cramping, started yesterday, RLQ. No bleeding.  Watery clear d/c- some odor.  Has been having diarrhea last 2 days, loose  Only once tolday.

## 2019-10-29 NOTE — MAU Provider Note (Signed)
History     CSN: [id number]  Arrival date and time: 10/29/19 1557   First Provider Initiated Contact with Patient 10/29/19 1644      Chief Complaint  Patient presents with  . Vaginal Discharge  . Abdominal Pain   HPI Tanya Chapman is a 29 y.o. [id number] at [redacted]w[redacted]d who presents to MAU with chief complaint of RLQ pain. This is a new problem, onset about two days ago. She rates her pain as 5/10, non-radiating. She denies aggravating or alleviating factors. She has not taken medication or tried other treatments for this complaint.  Patient also c/o vaginal spotting, new onset last Thursday after sexual intercourse. She states she spotted for about two days then her spotting resolved without intervention.  She denies dysuria, abdominal tenderness, constipation, fever or recent illness.  OB History    Gravida  7   Para  2   Term  2   Preterm  0   AB  4   Living  2     SAB  0   TAB  4   Ectopic  0   Multiple  0   Live Births  2           Past Medical History:  Diagnosis Date  . Fatigue   . Generalized headaches    migraines and generalized   . Hyperthyroidism   . MRSA carrier   . Thyroid disease    hyperthyroid   . Trouble swallowing   . Visual disturbance    eyes bulging, blurry, and hurting   . Weight loss, unintentional     Past Surgical History:  Procedure Laterality Date  . CESAREAN SECTION  11/22/08  . HERNIA REPAIR  1996  . nexplanon  01/23/2015   Nexplanon removed  . TOTAL THYROIDECTOMY  11/26/10    Family History  Problem Relation Age of Onset  . Migraines Mother   . Migraines Brother   . Diabetes Maternal Grandmother   . Hypertension Maternal Grandmother   . Hypertension Maternal Grandfather   . Stroke Maternal Grandfather   . Breast cancer Paternal Grandmother   . Diabetes Paternal Grandmother   . Thyroid disease Paternal Grandmother   . Hypertension Paternal Grandfather     Social History   Tobacco Use  . Smoking  status: Never Smoker  . Smokeless tobacco: Never Used  Vaping Use  . Vaping Use: Never used  Substance Use Topics  . Alcohol use: Not Currently    Alcohol/week: 0.0 standard drinks    Comment: socially  . Drug use: No    Allergies:  Allergies  Allergen Reactions  . Doxycycline Rash  . Provera [Medroxyprogesterone Acetate] Rash    Medications Prior to Admission  Medication Sig Dispense Refill Last Dose  . ferrous sulfate 325 (65 FE) MG tablet Take 1 tablet (325 mg total) by mouth daily. (Patient taking differently: Take 325 mg by mouth 4 (four) times daily. ) 30 tablet 0 Past Week at Unknown time  . levothyroxine (SYNTHROID) 200 MCG tablet Take 1 tablet (200 mcg total) by mouth daily before breakfast. 90 tablet 3 10/29/2019 at Unknown time  . norethindrone-ethinyl estradiol (JUNEL FE,GILDESS FE,LOESTRIN FE) 1-20 MG-MCG tablet Take 1 tablet by mouth daily. 3 Package 6 More than a month at Unknown time    Review of Systems  Constitutional: Negative for chills, fatigue and fever.  Gastrointestinal: Positive for abdominal pain.  Genitourinary: Negative for dysuria, vaginal bleeding, vaginal discharge and vaginal pain.  Musculoskeletal: Negative for  back pain.  All other systems reviewed and are negative.  Physical Exam   Blood pressure 127/83, pulse 79, temperature 99.1 F (37.3 C), temperature source Oral, resp. rate 16, height 5\' 4"  (1.626 m), last menstrual period 09/07/2019, SpO2 100 %, unknown if currently breastfeeding.  Physical Exam Vitals and nursing note reviewed. Exam conducted with a chaperone present.  Cardiovascular:     Rate and Rhythm: Normal rate.     Heart sounds: Normal heart sounds.  Pulmonary:     Effort: Pulmonary effort is normal.     Breath sounds: Normal breath sounds.  Abdominal:     General: Abdomen is flat.     Tenderness: There is no abdominal tenderness. There is no right CVA tenderness or left CVA tenderness.  Skin:    General: Skin is warm.      Capillary Refill: Capillary refill takes less than 2 seconds.  Neurological:     General: No focal deficit present.     Mental Status: She is alert.     MAU Course  Procedures: swabs via blind swab, formal 09/09/2019  Orders Placed This Encounter  Procedures  . Wet prep, genital  . US OB Comp Less 14 Wks  . CBC  . Comprehensive metabolic panel  . hCG, quantitative, pregnancy  . Urinalysis, Routine w reflex microscopic Urine, Clean Catch  . Nursing communication   Patient Vitals for the past 24 hrs:  BP Temp Temp src Pulse Resp SpO2 Height  10/29/19 1818 138/89 -- -- 70 -- 100 % --  10/29/19 1626 127/83 99.1 F (37.3 C) Oral 79 16 100 % 5\' 4"  (1.626 m)   Results for orders placed or performed during the hospital encounter of 10/29/19 (from the past 24 hour(s))  CBC     Status: Abnormal   Collection Time: 10/29/19  4:32 PM  Result Value Ref Range   WBC 7.0 4.0 - 10.5 K/uL   RBC 4.27 3.87 - 5.11 MIL/uL   Hemoglobin 12.0 12.0 - 15.0 g/dL   HCT 10/31/19 (L) 36 - 46 %   MCV 82.2 80.0 - 100.0 fL   MCH 28.1 26.0 - 34.0 pg   MCHC 34.2 30.0 - 36.0 g/dL   RDW 10/31/19 (H) 77.8 - 24.2 %   Platelets 356 150 - 400 K/uL   nRBC 0.0 0.0 - 0.2 %  Comprehensive metabolic panel     Status: Abnormal   Collection Time: 10/29/19  4:32 PM  Result Value Ref Range   Sodium 136 135 - 145 mmol/L   Potassium 3.4 (L) 3.5 - 5.1 mmol/L   Chloride 103 98 - 111 mmol/L   CO2 24 22 - 32 mmol/L   Glucose, Bld 86 70 - 99 mg/dL   BUN 6 6 - 20 mg/dL   Creatinine, Ser 61.4 0.44 - 1.00 mg/dL   Calcium 8.9 8.9 - 10/31/19 mg/dL   Total Protein 6.8 6.5 - 8.1 g/dL   Albumin 3.9 3.5 - 5.0 g/dL   AST 14 (L) 15 - 41 U/L   ALT 11 0 - 44 U/L   Alkaline Phosphatase 44 38 - 126 U/L   Total Bilirubin 0.5 0.3 - 1.2 mg/dL   GFR calc non Af Amer >60 >60 mL/min   GFR calc Af Amer >60 >60 mL/min   Anion gap 9 5 - 15  hCG, quantitative, pregnancy     Status: Abnormal   Collection Time: 10/29/19  4:32 PM  Result Value Ref Range    hCG, Beta  Chain, Sharene Butters, S 94,577 (H) <5 mIU/mL  Wet prep, genital     Status: Abnormal   Collection Time: 10/29/19  5:02 PM   Specimen: Vaginal  Result Value Ref Range   Yeast Wet Prep HPF POC NONE SEEN NONE SEEN   Trich, Wet Prep NONE SEEN NONE SEEN   Clue Cells Wet Prep HPF POC PRESENT (A) NONE SEEN   WBC, Wet Prep HPF POC MODERATE (A) NONE SEEN   Sperm NONE SEEN   Urinalysis, Routine w reflex microscopic Urine, Clean Catch     Status: None   Collection Time: 10/29/19  5:04 PM  Result Value Ref Range   Color, Urine YELLOW YELLOW   APPearance CLEAR CLEAR   Specific Gravity, Urine 1.010 1.005 - 1.030   pH 7.0 5.0 - 8.0   Glucose, UA NEGATIVE NEGATIVE mg/dL   Hgb urine dipstick NEGATIVE NEGATIVE   Bilirubin Urine NEGATIVE NEGATIVE   Ketones, ur NEGATIVE NEGATIVE mg/dL   Protein, ur NEGATIVE NEGATIVE mg/dL   Nitrite NEGATIVE NEGATIVE   Leukocytes,Ua NEGATIVE NEGATIVE   US OB Comp Less 14 Wks  Result Date: 10/29/2019 CLINICAL DATA:  Spotting EXAM: OBSTETRIC <14 WK ULTRASOUND TECHNIQUE: Transabdominal ultrasound was performed for evaluation of the gestation as well as the maternal uterus and adnexal regions. COMPARISON:  None. FINDINGS: Intrauterine gestational sac: Single Yolk sac:  Visualized. Embryo:  Visualized. Cardiac Activity: Visualized. Heart Rate: 155 bpm CRL:   14.1 mm   7 w 5 d                  [place] EDC: 06/11/2020 Subchorionic hemorrhage:  None visualized. Maternal uterus/adnexae: IMPRESSION: Single live IUP at 7 weeks and 5 days. No significant maternal abnormality detected. Electronically Signed   By: Katherine Mantle M.D.   On: 10/29/2019 17:52   Meds ordered this encounter  Medications  . metroNIDAZOLE (FLAGYL) 500 MG tablet    Sig: Take 1 tablet (500 mg total) by mouth 2 (two) times daily.    Dispense:  14 tablet    Refill:  0    Order Specific Question:   Supervising Provider    Answer:   Despina Hidden, LUTHER H [[id number]]  . Prenatal MV & Min w/FA-DHA (PRENATAL ADULT  GUMMY/DHA/FA) 0.4-25 MG CHEW    Sig: Chew 1 each by mouth daily.    Dispense:  30 tablet    Refill:  0    Order Specific Question:   Supervising Provider    Answer:   Lazaro Arms [[id number]]    Assessment and Plan  --29 y.o. [id number] with [facility] at [redacted]w[redacted]d c/w formal OB US --Bacterial Vaginosis. Pt requests prescription --Discharge home in stable condition  F/U: --Patient to establish prenatal care  Calvert Cantor, CNM 10/29/2019, 6:27 PM

## 2019-10-30 LAB — GC/CHLAMYDIA PROBE AMP (~~LOC~~) NOT AT ARMC
Chlamydia: NEGATIVE
Comment: NEGATIVE
Comment: NORMAL
Neisseria Gonorrhea: NEGATIVE

## 2019-11-30 ENCOUNTER — Emergency Department (HOSPITAL_COMMUNITY): Payer: Self-pay

## 2019-11-30 ENCOUNTER — Encounter (HOSPITAL_COMMUNITY): Payer: Self-pay

## 2019-11-30 ENCOUNTER — Emergency Department (HOSPITAL_COMMUNITY)
Admission: EM | Admit: 2019-11-30 | Discharge: 2019-11-30 | Disposition: A | Discharge status: Home / Self Care | Payer: Self-pay | Attending: Emergency Medicine | Admitting: Emergency Medicine

## 2019-11-30 ENCOUNTER — Other Ambulatory Visit: Payer: Self-pay

## 2019-11-30 DIAGNOSIS — G9389 Other specified disorders of brain: Secondary | ICD-10-CM | POA: Insufficient documentation

## 2019-11-30 DIAGNOSIS — E039 Hypothyroidism, unspecified: Secondary | ICD-10-CM | POA: Insufficient documentation

## 2019-11-30 DIAGNOSIS — Z79899 Other long term (current) drug therapy: Secondary | ICD-10-CM | POA: Insufficient documentation

## 2019-11-30 DIAGNOSIS — I10 Essential (primary) hypertension: Secondary | ICD-10-CM | POA: Insufficient documentation

## 2019-11-30 LAB — CBC WITH DIFFERENTIAL/PLATELET
Abs Immature Granulocytes: 0.02 10*3/uL (ref 0.00–0.07)
Basophils Absolute: 0.1 10*3/uL (ref 0.0–0.1)
Basophils Relative: 1 %
Eosinophils Absolute: 0.1 10*3/uL (ref 0.0–0.5)
Eosinophils Relative: 2 %
HCT: 38 % (ref 36.0–46.0)
Hemoglobin: 12.9 g/dL (ref 12.0–15.0)
Immature Granulocytes: 0 %
Lymphocytes Relative: 23 %
Lymphs Abs: 1.4 10*3/uL (ref 0.7–4.0)
MCH: 28.5 pg (ref 26.0–34.0)
MCHC: 33.9 g/dL (ref 30.0–36.0)
MCV: 83.9 fL (ref 80.0–100.0)
Monocytes Absolute: 0.4 10*3/uL (ref 0.1–1.0)
Monocytes Relative: 7 %
Neutro Abs: 3.9 10*3/uL (ref 1.7–7.7)
Neutrophils Relative %: 67 %
Platelets: 390 10*3/uL (ref 150–400)
RBC: 4.53 MIL/uL (ref 3.87–5.11)
RDW: 14.9 % (ref 11.5–15.5)
WBC: 5.9 10*3/uL (ref 4.0–10.5)
nRBC: 0 % (ref 0.0–0.2)

## 2019-11-30 LAB — COMPREHENSIVE METABOLIC PANEL
ALT: 12 U/L (ref 0–44)
AST: 16 U/L (ref 15–41)
Albumin: 4.5 g/dL (ref 3.5–5.0)
Alkaline Phosphatase: 52 U/L (ref 38–126)
Anion gap: 9 (ref 5–15)
BUN: 8 mg/dL (ref 6–20)
CO2: 26 mmol/L (ref 22–32)
Calcium: 9.3 mg/dL (ref 8.9–10.3)
Chloride: 105 mmol/L (ref 98–111)
Creatinine, Ser: 0.99 mg/dL (ref 0.44–1.00)
GFR, Estimated: >60 mL/min (ref >60)
Glucose, Bld: 110 mg/dL — ABNORMAL HIGH (ref 70–99)
Potassium: 3.4 mmol/L — ABNORMAL LOW (ref 3.5–5.1)
Sodium: 140 mmol/L (ref 135–145)
Total Bilirubin: 0.8 mg/dL (ref 0.3–1.2)
Total Protein: 8 g/dL (ref 6.5–8.1)

## 2019-11-30 MED ORDER — GADOBUTROL 1 MMOL/ML IV SOLN
9.0000 mL | Freq: Once | INTRAVENOUS | Status: AC | PRN
  Administered 2019-11-30: 9 mL via INTRAVENOUS

## 2019-11-30 MED ORDER — ONDANSETRON HCL 4 MG/2ML IJ SOLN
4.0000 mg | Freq: Once | INTRAMUSCULAR | Status: AC
  Administered 2019-11-30: 4 mg via INTRAVENOUS
  Filled 2019-11-30: qty 2

## 2019-11-30 MED ORDER — LISINOPRIL 20 MG PO TABS: 20.0000 mg | ORAL_TABLET | Freq: Every day | ORAL | 1 refills | Status: DC

## 2019-11-30 MED ORDER — HYDROMORPHONE HCL 1 MG/ML IJ SOLN
0.5000 mg | Freq: Once | INTRAMUSCULAR | Status: AC
  Administered 2019-11-30: 0.5 mg via INTRAVENOUS
  Filled 2019-11-30: qty 1

## 2019-11-30 NOTE — ED Triage Notes (Signed)
Patient c/o migraine x 1 week. Patient also reports blurred vision and sensitivity to light.  Patient states her BP at home 171/120 at home. BP today in triage 135/107. Patient does not take any antihypertension meds.

## 2019-11-30 NOTE — Progress Notes (Signed)
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 Her ct seems to be unchanged from 2017 which suggests this has not grown and is not even likely the source of her headaches. We will have her follow up as an outpatient with Dr. Maisie Fus.

## 2019-11-30 NOTE — ED Provider Notes (Signed)
[facility]-EMERGENCY DEPT Provider Note   CSN: [id number] Arrival date & time: 11/30/19  1316     History Chief Complaint  Patient presents with  . Migraine  . Hypertension    Tanya Chapman is a 29 y.o. female.  Patient complains of a headache.  Patient has had high blood pressure for a while but is not on any medicine  The history is provided by the patient and medical records. No language interpreter was used.  Migraine This is a new problem. The current episode started more than 2 days ago. The problem occurs constantly. The problem has been gradually improving. Pertinent negatives include no abdominal pain and no headaches. Nothing aggravates the symptoms. Nothing relieves the symptoms. She has tried nothing for the symptoms.  Hypertension Pertinent negatives include no abdominal pain and no headaches.       Past Medical History:  Diagnosis Date  . Fatigue   . Generalized headaches    migraines and generalized   . Hyperthyroidism   . MRSA carrier   . Thyroid disease    hyperthyroid   . Trouble swallowing   . Visual disturbance    eyes bulging, blurry, and hurting   . Weight loss, unintentional     Patient Active Problem List   Diagnosis Date Noted  . Hypothyroidism 08/31/2018  . Dysmenorrhea 10/22/2011  . Menorrhagia 10/22/2011  . Thyroid goiter 11/03/2010  . Grave's disease 11/03/2010    Past Surgical History:  Procedure Laterality Date  . CESAREAN SECTION  11/22/08  . HERNIA REPAIR  1996  . nexplanon  01/23/2015   Nexplanon removed  . TOTAL THYROIDECTOMY  11/26/10     OB History    Gravida  7   Para  2   Term  2   Preterm  0   AB  4   Living  2     SAB  0   TAB  4   Ectopic  0   Multiple  0   Live Births  2           Family History  Problem Relation Age of Onset  . Migraines Mother   . Migraines Brother   . Diabetes Maternal Grandmother   . Hypertension Maternal Grandmother   .  Hypertension Maternal Grandfather   . Stroke Maternal Grandfather   . Breast cancer Paternal Grandmother   . Diabetes Paternal Grandmother   . Thyroid disease Paternal Grandmother   . Hypertension Paternal Grandfather     Social History   Tobacco Use  . Smoking status: Never Smoker  . Smokeless tobacco: Never Used  Vaping Use  . Vaping Use: Never used  Substance Use Topics  . Alcohol use: Not Currently    Alcohol/week: 0.0 standard drinks    Comment: socially  . Drug use: No    Home Medications Prior to Admission medications   Medication Sig Start Date End Date Taking? Authorizing Provider  ferrous sulfate 325 (65 FE) MG tablet Take 1 tablet (325 mg total) by mouth daily. Patient taking differently: Take 325 mg by mouth 2 (two) times daily with a meal.  08/03/16  Yes Marny Lowenstein, PA-C  ibuprofen (ADVIL) 800 MG tablet Take 800 mg by mouth every 8 (eight) hours as needed for moderate pain.   Yes [provider]  levothyroxine (SYNTHROID) 200 MCG tablet Take 1 tablet (200 mcg total) by mouth daily before breakfast. 08/31/19  Yes Romero Belling, MD  medroxyPROGESTERone (DEPO-PROVERA) 150 MG/ML  injection Inject 150 mg into the muscle every 3 (three) months.   Yes [provider]  lisinopril (ZESTRIL) 20 MG tablet Take 1 tablet (20 mg total) by mouth daily. 11/30/19   Bethann BerkshireZammit, Hailly Fess, MD  metroNIDAZOLE (FLAGYL) 500 MG tablet Take 1 tablet (500 mg total) by mouth 2 (two) times daily. Patient not taking: Reported on 11/30/2019 10/29/19   Calvert CantorWeinhold, Samantha C, CNM  Prenatal MV & Min w/FA-DHA (PRENATAL ADULT GUMMY/DHA/FA) 0.4-25 MG CHEW Chew 1 each by mouth daily. Patient not taking: Reported on 11/30/2019 10/29/19   Calvert CantorWeinhold, Samantha C, CNM    Allergies    Doxycycline  Review of Systems   Review of Systems  Constitutional: Negative for appetite change and fatigue.  HENT: Negative for congestion, ear discharge and sinus pressure.   Eyes: Negative for discharge.    Respiratory: Negative for cough.   Gastrointestinal: Negative for abdominal pain and diarrhea.  Genitourinary: Negative for frequency and hematuria.  Musculoskeletal: Negative for back pain.  Skin: Negative for rash.  Neurological: Negative for seizures and headaches.       Headache  Psychiatric/Behavioral: Negative for hallucinations.    Physical Exam Updated Vital Signs BP 117/77 (BP Location: Right Arm)   Pulse (!) 102   Temp 99.7 F (37.6 C) (Oral)   Resp 18   Ht 5\' 4"  (1.626 m)   Wt 90.3 kg   LMP 11/30/2019   SpO2 99%   Breastfeeding Unknown   BMI 34.16 kg/m   Physical Exam Vitals and nursing note reviewed.  Constitutional:      Appearance: She is well-developed.  HENT:     Head: Normocephalic.     Nose: Nose normal.  Eyes:     General: No scleral icterus.    Conjunctiva/sclera: Conjunctivae normal.  Neck:     Thyroid: No thyromegaly.  Cardiovascular:     Rate and Rhythm: Normal rate and regular rhythm.     Heart sounds: No murmur heard.  No friction rub. No gallop.   Pulmonary:     Breath sounds: No stridor. No wheezing or rales.  Chest:     Chest wall: No tenderness.  Abdominal:     General: There is no distension.     Tenderness: There is no abdominal tenderness. There is no rebound.  Musculoskeletal:        General: Normal range of motion.     Cervical back: Neck supple.  Lymphadenopathy:     Cervical: No cervical adenopathy.  Skin:    Findings: No erythema or rash.  Neurological:     Mental Status: She is alert and oriented to person, place, and time.     Motor: No abnormal muscle tone.     Coordination: Coordination normal.  Psychiatric:        Behavior: Behavior normal.     ED Results / Procedures / Treatments   Labs (all labs ordered are listed, but only abnormal results are displayed) Labs Reviewed  COMPREHENSIVE METABOLIC PANEL - Abnormal; Notable for the following components:      Result Value   Potassium 3.4 (*)    Glucose, Bld  110 (*)    All other components within normal limits  CBC WITH DIFFERENTIAL/PLATELET    EKG None  Radiology CT Head Wo Contrast  Result Date: 11/30/2019 CLINICAL DATA:  Cerebral hemorrhage suspected. Additional history provided: Hypertension, headache, 1 week of dizziness. EXAM: CT HEAD WITHOUT CONTRAST TECHNIQUE: Contiguous axial images were obtained from the base of the skull through the  vertex without intravenous contrast. COMPARISON:  Noncontrast head CT 05/27/2015. FINDINGS: Brain: Cerebral volume is normal. Asymmetric prominence of the right lateral ventricle. There is no acute intracranial hemorrhage. No demarcated cortical infarct. No extra-axial fluid collection. No evidence of intracranial mass. No midline shift. Vascular: No hyperdense vessel. Skull: Normal. Negative for fracture or focal lesion. Sinuses/Orbits: Visualized orbits show no acute finding. No significant paranasal sinus disease or mastoid effusion at the imaged levels. IMPRESSION: Asymmetric prominence of the right lateral ventricle. This is unchanged as compared to the head CT of 05/27/2015 and may be congenital. However, given the provided history of headaches, brain MRI with contrast is recommended to assess for an obstructing cyst or other occult obstructing lesion. Otherwise normal non-contrast CT appearance of the brain. Electronically Signed   By: Jackey Loge DO   On: 11/30/2019 15:49   MR BRAIN W WO CONTRAST  Result Date: 11/30/2019 CLINICAL DATA:  Brain mass or lesion. EXAM: MRI HEAD WITHOUT AND WITH CONTRAST TECHNIQUE: Multiplanar, multiecho pulse sequences of the brain and surrounding structures were obtained without and with intravenous contrast. CONTRAST:  45mL GADAVIST GADOBUTROL 1 MMOL/ML IV SOLN COMPARISON:  Noncontrast head CT performed earlier the same day. FINDINGS: Brain: Cerebral volume is normal. There is a cyst within the atrium of the right lateral ventricle measuring 2.3 x 2.4 cm in transaxial  dimensions (series 14, images 12-14). There is no associated nodular or masslike enhancement. The cyst exerts mass effect upon the adjacent choroid plexus and appears to partially obstruct the right lateral ventricle with asymmetric prominence of the occipital and temporal horns. 4 mm focus of T2/FLAIR hyperintensity within the right temporal occipital periventricular white matter (series 15, image 12) (series 14, image 11). No other focal parenchymal signal abnormality is identified. There is no acute infarct. No chronic intracranial blood products. No extra-axial fluid collection. No midline shift. No abnormal intracranial enhancement. Vascular: Expected proximal arterial flow voids. Skull and upper cervical spine: No focal marrow lesion. Sinuses/Orbits: Visualized orbits show no acute finding. Tiny right maxillary sinus mucous retention cyst. Mild left maxillary sinus mucosal thickening. No significant mastoid effusion. 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 There is no associated nodular or mass-like enhancement. However, the cyst exerts mass effect upon the adjacent choroid plexus and appears to partially obstruct the right lateral ventricle with asymmetric prominence of the right occipital and temporal horns. 4 mm focus of T2/FLAIR hyperintensity within the right temporal-occipital periventricular white matter, sequela of a nonspecific remote insult. Electronically Signed   By: Jackey Loge DO   On: 11/30/2019 18:27    Procedures Procedures (including critical care time)  Medications Ordered in ED Medications  HYDROmorphone (DILAUDID) injection 0.5 mg (0.5 mg Intravenous Given 11/30/19 1540)  ondansetron (ZOFRAN) injection 4 mg (4 mg Intravenous Given 11/30/19 1538)  gadobutrol (GADAVIST) 1 MMOL/ML injection 9 mL (9 mLs Intravenous Contrast Given 11/30/19 1749)    ED Course  I have reviewed the triage vital signs and the nursing notes.  Pertinent labs &  imaging results that were available during my care of the patient were reviewed by me and considered in my medical decision making (see chart for details).    MDM Rules/Calculators/A&P                          Patient with high blood pressure.  She started on lisinopril.  I suspect headaches related to that.  Patient  also has a cyst in her head that I spoke with neurosurgery about and they do not feel like this is causing any head pain.  They will follow up with the patient next week but it is unlikely she will need any type of surgery Final Clinical Impression(s) / ED Diagnoses Final diagnoses:  Hypertension, unspecified type  Brain mass    Rx / DC Orders ED Discharge Orders         Ordered    lisinopril (ZESTRIL) 20 MG tablet  Daily        11/30/19 2017           Bethann Berkshire, MD 11/30/19 2021

## 2019-11-30 NOTE — Discharge Instructions (Signed)
Follow-up with Dr. Maisie Fus next week for your cyst in your head.  Follow-up with your family doctor in a couple weeks for your blood pressure

## 2020-03-17 ENCOUNTER — Emergency Department (HOSPITAL_COMMUNITY): Admission: EM | Admit: 2020-03-17 | Discharge: 2020-03-17 | Discharge status: Against Medical Advice | Payer: Self-pay

## 2020-03-17 ENCOUNTER — Other Ambulatory Visit: Payer: Self-pay

## 2020-09-02 ENCOUNTER — Other Ambulatory Visit: Payer: Self-pay | Admitting: Endocrinology

## 2020-09-04 ENCOUNTER — Ambulatory Visit: Payer: Self-pay | Admitting: Endocrinology

## 2021-02-23 ENCOUNTER — Encounter (HOSPITAL_BASED_OUTPATIENT_CLINIC_OR_DEPARTMENT_OTHER): Payer: Self-pay

## 2021-02-23 ENCOUNTER — Other Ambulatory Visit: Payer: Self-pay

## 2021-02-23 ENCOUNTER — Emergency Department (HOSPITAL_BASED_OUTPATIENT_CLINIC_OR_DEPARTMENT_OTHER)
Admission: EM | Admit: 2021-02-23 | Discharge: 2021-02-23 | Disposition: A | Discharge status: Home / Self Care | Payer: Self-pay | Attending: Emergency Medicine | Admitting: Emergency Medicine

## 2021-02-23 DIAGNOSIS — K439 Ventral hernia without obstruction or gangrene: Secondary | ICD-10-CM | POA: Insufficient documentation

## 2021-02-23 DIAGNOSIS — Z79899 Other long term (current) drug therapy: Secondary | ICD-10-CM | POA: Insufficient documentation

## 2021-02-23 NOTE — Discharge Instructions (Addendum)
As we discussed the mass that I palpated is consistent with a hernia of your abdominal wall.  I do not see any signs of strangulation or incarceration as we discussed.  If you do develop persistent pain, redness, significant swelling please return for further evaluation.  Otherwise I have attached the information of a general surgeon Dr. Daphine Deutscher you can call for discussion of hernia repair.  It was a pleasure meeting you, taking care of you today.  I hope that you are able to be scheduled for surgery in the near future. Please do not hesitate to return if you have any questions or concerns.

## 2021-02-23 NOTE — ED Triage Notes (Signed)
Pt c/o "knot in my stomach" x 2 years-states area is bigger x 1 week-NAD-steady gait

## 2021-02-23 NOTE — ED Provider Notes (Signed)
[facility] EMERGENCY DEPARTMENT Provider Note   CSN: [id number] Arrival date & time: 02/23/21  1108     History  Chief Complaint  Patient presents with   Abdominal Pain    Tanya Chapman is a 31 y.o. female with past medical history of previous umbilical hernia repair in childhood who presents with concern for abdominal mass, which is increased in size, has been throbbing for the last several months.  Patient reports worse with abdominal fullness, she can feel it when she sits up.  Patient denies any redness, constant pain, fever.  Denies pain at this time.   Abdominal Pain     Home Medications Prior to Admission medications   Medication Sig Start Date End Date Taking? Authorizing Provider  ferrous sulfate 325 (65 FE) MG tablet Take 1 tablet (325 mg total) by mouth daily. Patient taking differently: Take 325 mg by mouth 2 (two) times daily with a meal.  08/03/16   Luvenia Redden, PA-C  ibuprofen (ADVIL) 800 MG tablet Take 800 mg by mouth every 8 (eight) hours as needed for moderate pain.    [provider]  levothyroxine (SYNTHROID) 200 MCG tablet TAKE 1 TABLET BY MOUTH ONCE DAILY BEFORE  BREAKFAST 09/03/20   Renato Shin, MD  lisinopril (ZESTRIL) 20 MG tablet Take 1 tablet (20 mg total) by mouth daily. 11/30/19   Milton Ferguson, MD  medroxyPROGESTERone (DEPO-PROVERA) 150 MG/ML injection Inject 150 mg into the muscle every 3 (three) months.    [provider]  metroNIDAZOLE (FLAGYL) 500 MG tablet Take 1 tablet (500 mg total) by mouth 2 (two) times daily. Patient not taking: Reported on 11/30/2019 10/29/19   Darlina Rumpf, CNM  Prenatal MV & Min w/FA-DHA (PRENATAL ADULT GUMMY/DHA/FA) 0.4-25 MG CHEW Chew 1 each by mouth daily. Patient not taking: Reported on 11/30/2019 10/29/19   Darlina Rumpf, CNM      Allergies    Doxycycline    Review of Systems   Review of Systems  Gastrointestinal:  Positive for abdominal pain.  All other  systems reviewed and are negative.  Physical Exam Updated Vital Signs BP (!) 153/96 (BP Location: Left Arm)    Pulse 88    Temp 98.7 F (37.1 C) (Oral)    Resp 20    Ht 5\' 4"  (1.626 m)    Wt 96.6 kg    LMP 02/01/2021    SpO2 100%    BMI 36.56 kg/m  Physical Exam Vitals and nursing note reviewed.  Constitutional:      General: She is not in acute distress.    Appearance: Normal appearance.  HENT:     Head: Normocephalic and atraumatic.  Eyes:     General:        Right eye: No discharge.        Left eye: No discharge.  Cardiovascular:     Rate and Rhythm: Normal rate and regular rhythm.  Pulmonary:     Effort: Pulmonary effort is normal. No respiratory distress.  Abdominal:     Comments: Patient has a palpable mass about 4 to 5 cm superior to the umbilicus.  It increases in size with Valsalva.  There is no evidence of strangulation, incarceration.  It is compatible with a ventral hernia.  It is easily reducible.  Musculoskeletal:        General: No deformity.  Skin:    General: Skin is warm and dry.  Neurological:     Mental Status: She is alert  and oriented to person, place, and time.  Psychiatric:        Mood and Affect: Mood normal.        Behavior: Behavior normal.    ED Results / Procedures / Treatments   Labs (all labs ordered are listed, but only abnormal results are displayed) Labs Reviewed - No data to display  EKG None  Radiology No results found.  Procedures Procedures    Medications Ordered in ED Medications - No data to display  ED Course/ Medical Decision Making/ A&P                           Medical Decision Making  This is an overall well-appearing patient with a history of previous umbilical hernia repair in childhood who presents with new palpable mass in abdomen, that is worse after eating, and increasing in size.  Patient reports it is occasionally slightly tender, but not exceptionally so.  Patient not taking anything for pain at this time.   Clinically it does appear to be a medium sized ventral hernia.  It is easily reducible.  There is no signs of incarceration, strangulation.  Encourage patient to follow-up with general surgery for possible hernia repair.  Encouraged follow-up if symptoms worsen, fail to improve, discussed appearance of incarcerated, strangulated hernia, and warning signs to watch for.  Patient discharged in stable condition at this time, return precautions given. Final Clinical Impression(s) / ED Diagnoses Final diagnoses:  None    Rx / DC Orders ED Discharge Orders     None         Anselmo Pickler, PA-C 02/23/21 1213    Lennice Sites, DO 02/23/21 1537

## 2021-04-15 ENCOUNTER — Other Ambulatory Visit: Payer: Self-pay | Admitting: Endocrinology

## 2021-05-25 IMAGING — US US OB COMP LESS 14 WK
1 series · 15 of 28 positions shown · non-contrast
Comparison: None.

CLINICAL DATA: Spotting

EXAM:
OBSTETRIC <14 WK ULTRASOUND
TECHNIQUE: Transabdominal ultrasound was performed for evaluation of the
gestation as well as the maternal uterus and adnexal regions.

[Series 1: us ob comp less 14 wk · 15 of 31 slices shown]
[im 1/31]
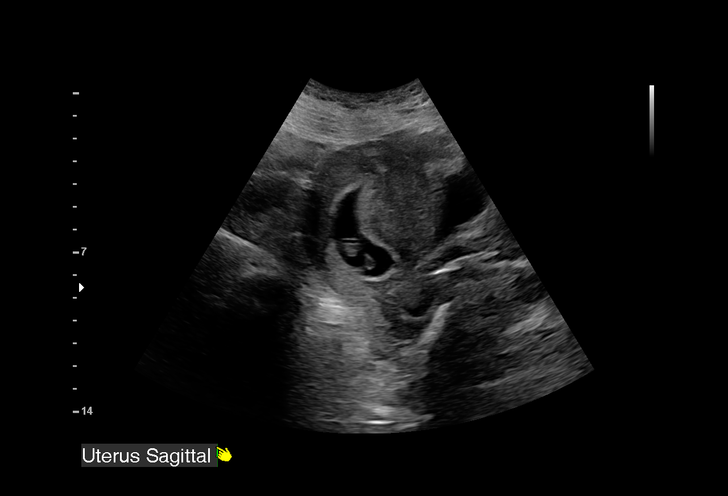
[im 3/31]
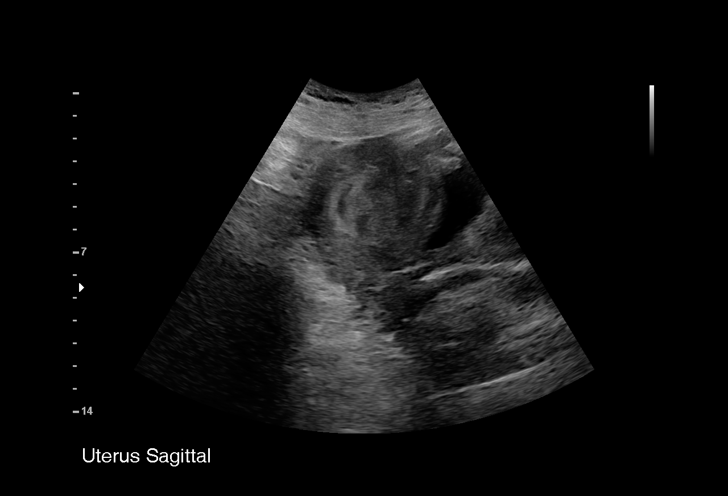
[im 5/31]
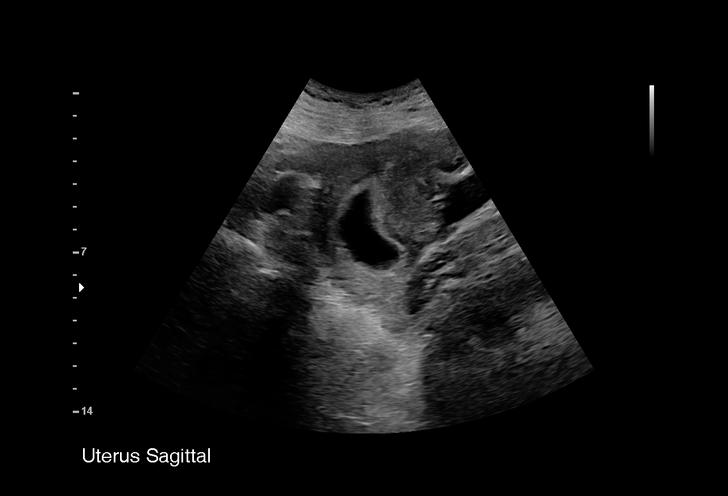
[im 7/31]
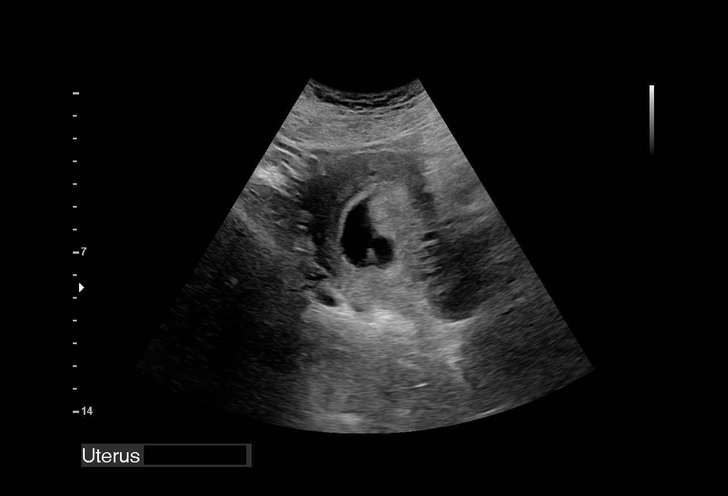
[im 9/31]
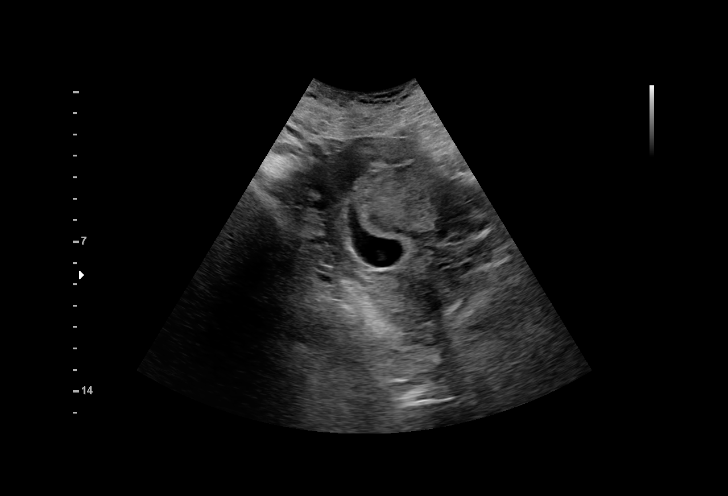
[im 12/31]
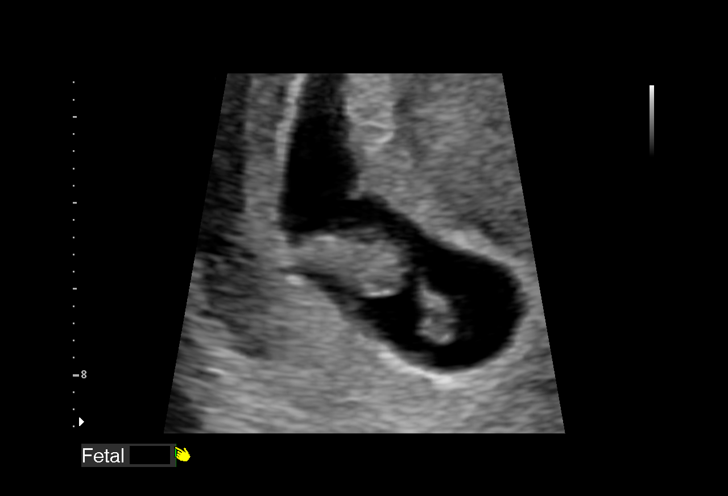
[im 14/31]
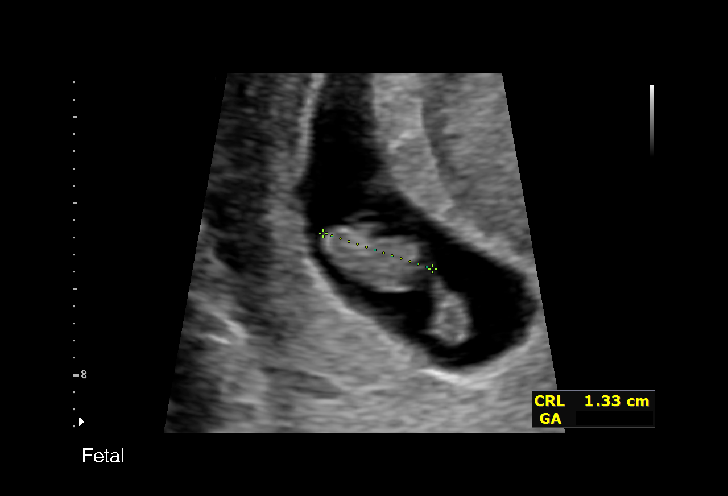
[im 16/31]
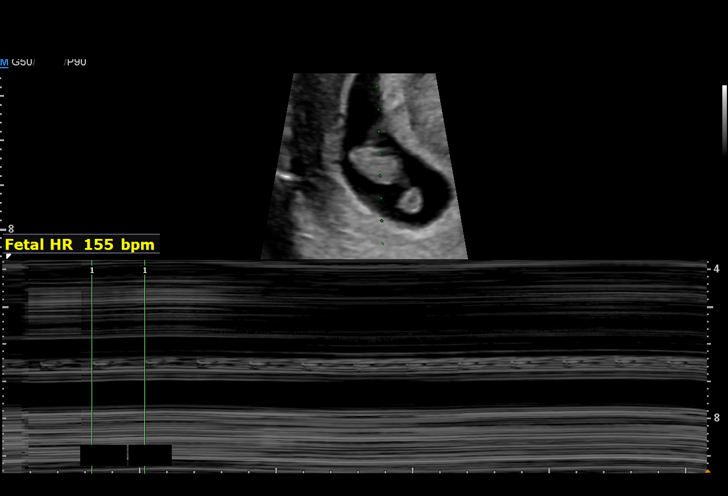
[im 17/31]
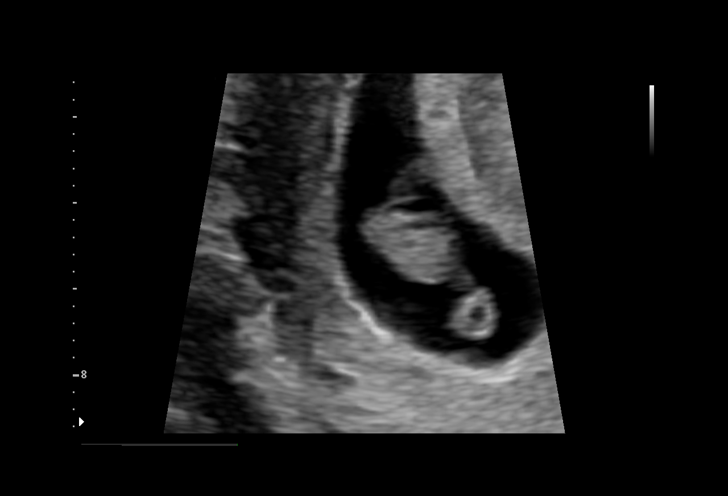
[im 19/31]
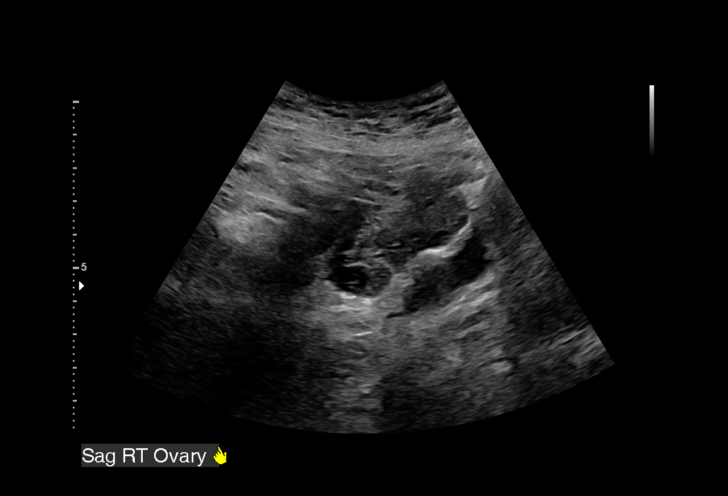
[im 22/31]
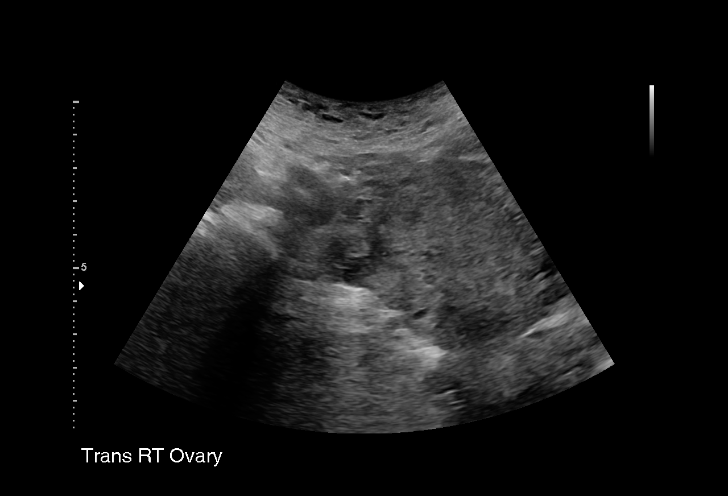
[im 24/31]
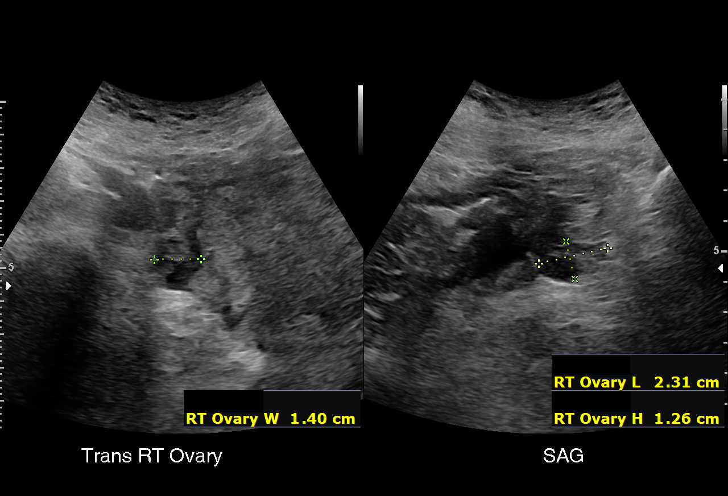
[im 26/31]
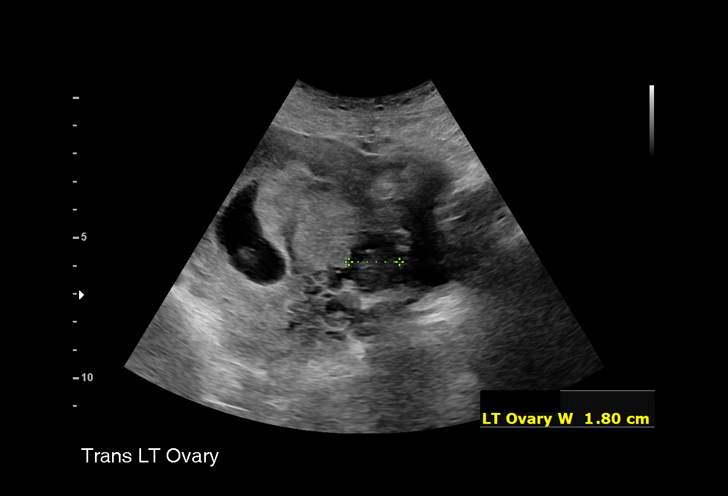
[im 28/31]
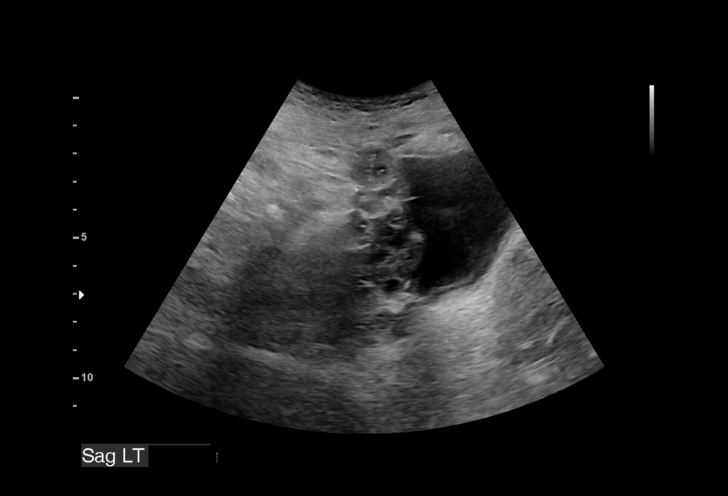
[im 31/31]
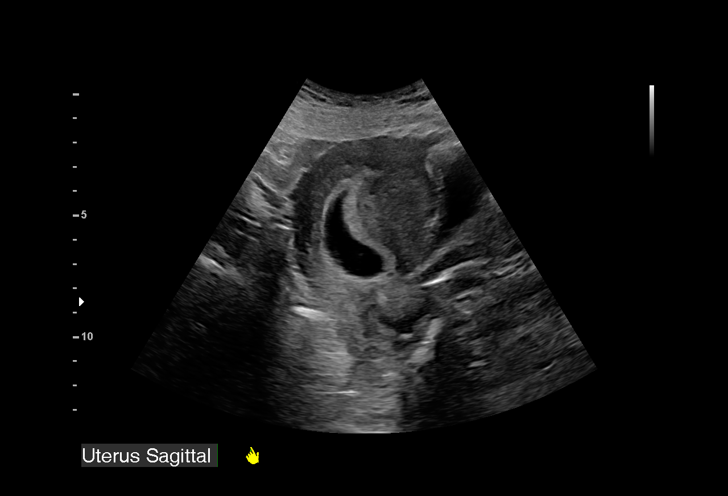

[15 of 28 positions shown; findings below may reference images not displayed]

FINDINGS: Intrauterine gestational sac: Single

Yolk sac:  Visualized.

Embryo:  Visualized.

Cardiac Activity: Visualized.

Heart Rate: 155 bpm

CRL:   14.1 mm   7 w 5 d                  US EDC: 06/11/2020

Subchorionic hemorrhage:  None visualized.

Maternal uterus/adnexae:
IMPRESSION: Single live IUP at 7 weeks and 5 days. No significant maternal
abnormality detected.

## 2021-06-03 ENCOUNTER — Other Ambulatory Visit: Payer: Self-pay | Admitting: Endocrinology

## 2021-06-08 ENCOUNTER — Other Ambulatory Visit: Payer: Self-pay | Admitting: Endocrinology

## 2021-06-08 ENCOUNTER — Ambulatory Visit: Payer: Self-pay | Admitting: Endocrinology

## 2021-06-08 ENCOUNTER — Other Ambulatory Visit: Payer: Self-pay

## 2021-06-08 DIAGNOSIS — E890 Postprocedural hypothyroidism: Secondary | ICD-10-CM

## 2021-06-08 MED ORDER — LEVOTHYROXINE SODIUM 200 MCG PO TABS: 200.0000 ug | ORAL_TABLET | Freq: Every day | ORAL | 0 refills | Status: AC

## 2021-06-08 NOTE — Telephone Encounter (Signed)
Received message from front desk that patient will run out of medication before she is seen by the doctor. Dr Lucianne Muss ok'd to send Rx in. Sent 30 day supply ?

## 2021-06-26 NOTE — Patient Instructions (Signed)
DUE TO COVID-19 ONLY TWO VISITORS  (aged 31 and older)  ARE ALLOWED TO COME WITH YOU AND STAY IN THE WAITING ROOM ONLY DURING PRE OP AND PROCEDURE.   **NO VISITORS ARE ALLOWED IN THE SHORT STAY AREA OR RECOVERY ROOM!!**  IF YOU WILL BE ADMITTED INTO THE HOSPITAL YOU ARE ALLOWED ONLY FOUR SUPPORT PEOPLE DURING VISITATION HOURS ONLY (7 AM -8PM)   The support person(s) must pass our screening, gel in and out, and wear a mask at all times, including in the patient's room. Patients must also wear a mask when staff or their support person are in the room. Visitors GUEST BADGE MUST BE WORN VISIBLY  One adult visitor may remain with you overnight and MUST be in the room by 8 P.M.     Your procedure is scheduled on: 07/03/21   Report to [facility] Main Entrance    Report to admitting at : 9:00 AM   Call this number if you have problems the morning of surgery [phone]   Eat a light diet the day before surgery.  Examples including soups, broths, toast, yogurt, mashed potatoes.  Things to avoid include carbonated beverages (fizzy beverages), raw fruits and raw vegetables, or beans.   If your bowels are filled with gas, your surgeon will have difficulty visualizing your pelvic organs which increases your surgical risks.   Do not eat food :After Midnight.   After Midnight you may have the following liquids until : 8:00 AM DAY OF SURGERY  Water Black Coffee (sugar ok, NO MILK/CREAM OR CREAMERS)  Tea (sugar ok, NO MILK/CREAM OR CREAMERS) regular and decaf                             Plain Jell-O (NO RED)                                           Fruit ices (not with fruit pulp, NO RED)                                     Popsicles (NO RED)                                                                  Juice: apple, WHITE grape, WHITE cranberry Sports drinks like Gatorade (NO RED) Clear broth(vegetable,chicken,beef)           FOLLOW BOWEL PREP AND ANY ADDITIONAL PRE OP INSTRUCTIONS  YOU RECEIVED FROM YOUR SURGEON'S OFFICE!!!     Oral Hygiene is also important to reduce your risk of infection.                                    Remember - BRUSH YOUR TEETH THE MORNING OF SURGERY WITH YOUR REGULAR TOOTHPASTE   Do NOT smoke after Midnight   Take these medicines the morning of surgery with A SIP OF WATER: synthroid  DO NOT TAKE ANY ORAL DIABETIC MEDICATIONS DAY  OF YOUR SURGERY  Bring CPAP mask and tubing day of surgery.                              You may not have any metal on your body including hair pins, jewelry, and body piercing             Do not wear make-up, lotions, powders, perfumes/cologne, or deodorant  Do not wear nail polish including gel and S&S, artificial/acrylic nails, or any other type of covering on natural nails including finger and toenails. If you have artificial nails, gel coating, etc. that needs to be removed by a nail salon please have this removed prior to surgery or surgery may need to be canceled/ delayed if the surgeon/ anesthesia feels like they are unable to be safely monitored.   Do not shave  48 hours prior to surgery.   Do not bring valuables to the hospital. [place].   Contacts, dentures or bridgework may not be worn into surgery.   Bring small overnight bag day of surgery.    Patients discharged on the day of surgery will not be allowed to drive home.  Someone NEEDS to stay with you for the first 24 hours after anesthesia.   Special Instructions: Bring a copy of your healthcare power of attorney and living will documents         the day of surgery if you haven't scanned them before.              Please read over the following fact sheets you were given: IF YOU HAVE QUESTIONS ABOUT YOUR PRE-OP INSTRUCTIONS PLEASE CALL [phone]     [facility] - Preparing for Surgery Before surgery, you can play an important role.  Because skin is not sterile, your skin needs to be as free  of germs as possible.  You can reduce the number of germs on your skin by washing with CHG (chlorahexidine gluconate) soap before surgery.  CHG is an antiseptic cleaner which kills germs and bonds with the skin to continue killing germs even after washing. Please DO NOT use if you have an allergy to CHG or antibacterial soaps.  If your skin becomes reddened/irritated stop using the CHG and inform your nurse when you arrive at Short Stay. Do not shave (including legs and underarms) for at least 48 hours prior to the first CHG shower.  You may shave your face/neck. Please follow these instructions carefully:  1.  Shower with CHG Soap the night before surgery and the  morning of Surgery.  2.  If you choose to wash your hair, wash your hair first as usual with your  normal  shampoo.  3.  After you shampoo, rinse your hair and body thoroughly to remove the  shampoo.                           4.  Use CHG as you would any other liquid soap.  You can apply chg directly  to the skin and wash                       Gently with a scrungie or clean washcloth.  5.  Apply the CHG Soap to your body ONLY FROM THE NECK DOWN.  Do not use on face/ open                           Wound or open sores. Avoid contact with eyes, ears mouth and genitals (private parts).                       Wash face,  Genitals (private parts) with your normal soap.             6.  Wash thoroughly, paying special attention to the area where your surgery  will be performed.  7.  Thoroughly rinse your body with warm water from the neck down.  8.  DO NOT shower/wash with your normal soap after using and rinsing off  the CHG Soap.                9.  Pat yourself dry with a clean towel.            10.  Wear clean pajamas.            11.  Place clean sheets on your bed the night of your first shower and do not  sleep with pets. Day of Surgery : Do not apply any lotions/deodorants the morning of surgery.  Please wear clean clothes to the  hospital/surgery center.  FAILURE TO FOLLOW THESE INSTRUCTIONS MAY RESULT IN THE CANCELLATION OF YOUR SURGERY PATIENT SIGNATURE_________________________________  NURSE SIGNATURE__________________________________  ________________________________________________________________________

## 2021-06-26 NOTE — Progress Notes (Signed)
Pt. Needs orders for upcoming surgery. ?

## 2021-06-29 ENCOUNTER — Encounter (HOSPITAL_COMMUNITY)
Admission: RE | Admit: 2021-06-29 | Discharge: 2021-06-29 | Disposition: A | Discharge status: Home / Self Care | Payer: BC Managed Care – PPO | Source: Ambulatory Visit | Attending: Surgery | Admitting: Surgery

## 2021-06-29 ENCOUNTER — Encounter (HOSPITAL_COMMUNITY): Payer: Self-pay

## 2021-06-29 ENCOUNTER — Other Ambulatory Visit: Payer: Self-pay

## 2021-06-29 VITALS — BP 165/118 | HR 62 | Temp 98.7°F | Ht 64.0 in | Wt 213.0 lb

## 2021-06-29 DIAGNOSIS — Z01818 Encounter for other preprocedural examination: Secondary | ICD-10-CM | POA: Insufficient documentation

## 2021-06-29 HISTORY — DX: Depression, unspecified: F32.A

## 2021-06-29 HISTORY — DX: Anxiety disorder, unspecified: F41.9

## 2021-06-29 LAB — BASIC METABOLIC PANEL
Anion gap: 6 (ref 5–15)
BUN: 9 mg/dL (ref 6–20)
CO2: 27 mmol/L (ref 22–32)
Calcium: 8.8 mg/dL — ABNORMAL LOW (ref 8.9–10.3)
Chloride: 107 mmol/L (ref 98–111)
Creatinine, Ser: 0.92 mg/dL (ref 0.44–1.00)
GFR, Estimated: >60 mL/min (ref >60)
Glucose, Bld: 97 mg/dL (ref 70–99)
Potassium: 3.7 mmol/L (ref 3.5–5.1)
Sodium: 140 mmol/L (ref 135–145)

## 2021-06-29 LAB — CBC
HCT: 40.9 % (ref 36.0–46.0)
Hemoglobin: 13.7 g/dL (ref 12.0–15.0)
MCH: 29 pg (ref 26.0–34.0)
MCHC: 33.5 g/dL (ref 30.0–36.0)
MCV: 86.5 fL (ref 80.0–100.0)
Platelets: 342 10*3/uL (ref 150–400)
RBC: 4.73 MIL/uL (ref 3.87–5.11)
RDW: 12.6 % (ref 11.5–15.5)
WBC: 5 10*3/uL (ref 4.0–10.5)
nRBC: 0 % (ref 0.0–0.2)

## 2021-06-29 NOTE — Progress Notes (Signed)
For Short Stay: COVID SWAB appointment date: Date of COVID positive in last 90 days:  Bowel Prep reminder: N/A   For Anesthesia: PCP - Shanda Bumps Livengood: PAC Cardiologist - N/A  Chest x-ray -  EKG -  Stress Test -  ECHO -  Cardiac Cath -  Pacemaker/ICD device last checked: Pacemaker orders received: Device Rep notified:  Spinal Cord Stimulator:  Sleep Study -  CPAP -   Fasting Blood Sugar -  Checks Blood Sugar _____ times a day Date and result of last Hgb A1c-  Blood Thinner Instructions: Aspirin Instructions: Last Dose:  Activity level: Can go up a flight of stairs and activities of daily living without stopping and without chest pain and/or shortness of breath   Able to exercise without chest pain and/or shortness of breath   Unable to go up a flight of stairs without chest pain and/or shortness of breath     Anesthesia review:   Patient denies shortness of breath, fever, cough and chest pain at PAT appointment   Patient verbalized understanding of instructions that were given to them at the PAT appointment. Patient was also instructed that they will need to review over the PAT instructions again at home before surgery.

## 2021-07-01 ENCOUNTER — Ambulatory Visit: Payer: Self-pay | Admitting: Endocrinology

## 2021-07-03 ENCOUNTER — Ambulatory Visit (HOSPITAL_COMMUNITY)
Admission: RE | Admit: 2021-07-03 | Discharge: 2021-07-03 | Disposition: A | Discharge status: Home / Self Care | Payer: BC Managed Care – PPO | Attending: Surgery | Admitting: Surgery

## 2021-07-03 ENCOUNTER — Ambulatory Visit (HOSPITAL_COMMUNITY): Payer: BC Managed Care – PPO | Admitting: Certified Registered Nurse Anesthetist

## 2021-07-03 ENCOUNTER — Other Ambulatory Visit: Payer: Self-pay

## 2021-07-03 ENCOUNTER — Ambulatory Visit (HOSPITAL_COMMUNITY): Payer: BC Managed Care – PPO | Admitting: Physician Assistant

## 2021-07-03 ENCOUNTER — Encounter (HOSPITAL_COMMUNITY)
Admission: RE | Disposition: A | Discharge status: Home / Self Care | Payer: Self-pay | Source: Home / Self Care | Attending: Surgery

## 2021-07-03 DIAGNOSIS — Z01818 Encounter for other preprocedural examination: Secondary | ICD-10-CM

## 2021-07-03 DIAGNOSIS — K5900 Constipation, unspecified: Secondary | ICD-10-CM | POA: Insufficient documentation

## 2021-07-03 DIAGNOSIS — K439 Ventral hernia without obstruction or gangrene: Secondary | ICD-10-CM | POA: Insufficient documentation

## 2021-07-03 DIAGNOSIS — Z6836 Body mass index (BMI) 36.0-36.9, adult: Secondary | ICD-10-CM | POA: Insufficient documentation

## 2021-07-03 DIAGNOSIS — E890 Postprocedural hypothyroidism: Secondary | ICD-10-CM | POA: Diagnosis not present

## 2021-07-03 DIAGNOSIS — E0500 Thyrotoxicosis with diffuse goiter without thyrotoxic crisis or storm: Secondary | ICD-10-CM | POA: Insufficient documentation

## 2021-07-03 HISTORY — PX: UMBILICAL HERNIA REPAIR: SHX196

## 2021-07-03 LAB — PREGNANCY, URINE: Preg Test, Ur: NEGATIVE

## 2021-07-03 SURGERY — REPAIR, HERNIA, UMBILICAL, LAPAROSCOPIC
Anesthesia: General

## 2021-07-03 MED ORDER — SUGAMMADEX SODIUM 500 MG/5ML IV SOLN
INTRAVENOUS | Status: AC
  Filled 2021-07-03: qty 5

## 2021-07-03 MED ORDER — HYDROCODONE-ACETAMINOPHEN 5-325 MG PO TABS: 1.0000 | ORAL_TABLET | Freq: Four times a day (QID) | ORAL | 0 refills | Status: DC | PRN

## 2021-07-03 MED ORDER — LACTATED RINGERS IV SOLN
INTRAVENOUS | Status: DC | PRN
  Administered 2021-07-03: 1000 mL via INTRAVENOUS

## 2021-07-03 MED ORDER — PHENYLEPHRINE 80 MCG/ML (10ML) SYRINGE FOR IV PUSH (FOR BLOOD PRESSURE SUPPORT)
PREFILLED_SYRINGE | INTRAVENOUS | Status: DC | PRN
  Administered 2021-07-03: 80 ug via INTRAVENOUS

## 2021-07-03 MED ORDER — DEXAMETHASONE SODIUM PHOSPHATE 10 MG/ML IJ SOLN
INTRAMUSCULAR | Status: DC | PRN
  Administered 2021-07-03: 7 mg via INTRAVENOUS

## 2021-07-03 MED ORDER — BUPIVACAINE LIPOSOME 1.3 % IJ SUSP
INTRAMUSCULAR | Status: DC | PRN
  Administered 2021-07-03: 20 mL

## 2021-07-03 MED ORDER — OXYCODONE HCL 5 MG PO TABS: 5.0000 mg | ORAL_TABLET | Freq: Once | ORAL | Status: DC | PRN

## 2021-07-03 MED ORDER — CHLORHEXIDINE GLUCONATE 0.12 % MT SOLN
15.0000 mL | Freq: Once | OROMUCOSAL | Status: AC
  Administered 2021-07-03: 15 mL via OROMUCOSAL

## 2021-07-03 MED ORDER — ONDANSETRON HCL 4 MG/2ML IJ SOLN: 4.0000 mg | Freq: Once | INTRAMUSCULAR | Status: DC | PRN

## 2021-07-03 MED ORDER — CHLORHEXIDINE GLUCONATE CLOTH 2 % EX PADS: 6.0000 | MEDICATED_PAD | Freq: Once | CUTANEOUS | Status: DC

## 2021-07-03 MED ORDER — ROCURONIUM BROMIDE 10 MG/ML (PF) SYRINGE
PREFILLED_SYRINGE | INTRAVENOUS | Status: DC | PRN
  Administered 2021-07-03: 20 mg via INTRAVENOUS
  Administered 2021-07-03: 80 mg via INTRAVENOUS
  Administered 2021-07-03: 10 mg via INTRAVENOUS

## 2021-07-03 MED ORDER — LIDOCAINE HCL (PF) 2 % IJ SOLN
INTRAMUSCULAR | Status: AC
  Filled 2021-07-03: qty 5

## 2021-07-03 MED ORDER — PROPOFOL 10 MG/ML IV BOLUS
INTRAVENOUS | Status: DC | PRN
  Administered 2021-07-03: 200 mg via INTRAVENOUS

## 2021-07-03 MED ORDER — BUPIVACAINE-EPINEPHRINE (PF) 0.25% -1:200000 IJ SOLN
INTRAMUSCULAR | Status: AC
  Filled 2021-07-03: qty 30

## 2021-07-03 MED ORDER — MIDAZOLAM HCL 2 MG/2ML IJ SOLN
INTRAMUSCULAR | Status: AC
  Filled 2021-07-03: qty 2

## 2021-07-03 MED ORDER — FLEET ENEMA 7-19 GM/118ML RE ENEM: 1.0000 | ENEMA | Freq: Once | RECTAL | Status: DC

## 2021-07-03 MED ORDER — PROPOFOL 10 MG/ML IV BOLUS
INTRAVENOUS | Status: AC
  Filled 2021-07-03: qty 20

## 2021-07-03 MED ORDER — ROCURONIUM BROMIDE 10 MG/ML (PF) SYRINGE
PREFILLED_SYRINGE | INTRAVENOUS | Status: AC
  Filled 2021-07-03: qty 10

## 2021-07-03 MED ORDER — FENTANYL CITRATE PF 50 MCG/ML IJ SOSY
PREFILLED_SYRINGE | INTRAMUSCULAR | Status: AC
  Filled 2021-07-03: qty 2

## 2021-07-03 MED ORDER — CEFAZOLIN IN SODIUM CHLORIDE 3-0.9 GM/100ML-% IV SOLN
3.0000 g | INTRAVENOUS | Status: AC
  Administered 2021-07-03: 3 g via INTRAVENOUS
  Filled 2021-07-03: qty 100

## 2021-07-03 MED ORDER — FENTANYL CITRATE PF 50 MCG/ML IJ SOSY
25.0000 ug | PREFILLED_SYRINGE | INTRAMUSCULAR | Status: DC | PRN
  Administered 2021-07-03: 50 ug via INTRAVENOUS

## 2021-07-03 MED ORDER — ORAL CARE MOUTH RINSE: 15.0000 mL | Freq: Once | OROMUCOSAL | Status: AC

## 2021-07-03 MED ORDER — MIDAZOLAM HCL 5 MG/5ML IJ SOLN
INTRAMUSCULAR | Status: DC | PRN
  Administered 2021-07-03: 2 mg via INTRAVENOUS

## 2021-07-03 MED ORDER — FENTANYL CITRATE (PF) 100 MCG/2ML IJ SOLN
INTRAMUSCULAR | Status: DC | PRN
Start: 2021-07-03 — End: 2021-07-03
  Administered 2021-07-03 (×3): 50 ug via INTRAVENOUS

## 2021-07-03 MED ORDER — OXYCODONE HCL 5 MG/5ML PO SOLN: 5.0000 mg | Freq: Once | ORAL | Status: DC | PRN

## 2021-07-03 MED ORDER — SODIUM CHLORIDE (PF) 0.9 % IJ SOLN
INTRAMUSCULAR | Status: AC
  Filled 2021-07-03: qty 10

## 2021-07-03 MED ORDER — SODIUM CHLORIDE (PF) 0.9 % IJ SOLN
INTRAMUSCULAR | Status: DC | PRN
  Administered 2021-07-03: 10 mL

## 2021-07-03 MED ORDER — ONDANSETRON HCL 4 MG/2ML IJ SOLN
INTRAMUSCULAR | Status: AC
  Filled 2021-07-03: qty 2

## 2021-07-03 MED ORDER — ONDANSETRON HCL 4 MG/2ML IJ SOLN
INTRAMUSCULAR | Status: DC | PRN
  Administered 2021-07-03: 4 mg via INTRAVENOUS

## 2021-07-03 MED ORDER — DEXAMETHASONE SODIUM PHOSPHATE 10 MG/ML IJ SOLN
INTRAMUSCULAR | Status: AC
  Filled 2021-07-03: qty 1

## 2021-07-03 MED ORDER — FENTANYL CITRATE (PF) 100 MCG/2ML IJ SOLN
INTRAMUSCULAR | Status: AC
Start: 2021-07-03 — End: ?
  Filled 2021-07-03: qty 2

## 2021-07-03 MED ORDER — SCOPOLAMINE 1 MG/3DAYS TD PT72
1.0000 | MEDICATED_PATCH | TRANSDERMAL | Status: DC
  Administered 2021-07-03: 1.5 mg via TRANSDERMAL
  Filled 2021-07-03: qty 1

## 2021-07-03 MED ORDER — SODIUM CHLORIDE 0.9 % IR SOLN
Status: DC | PRN
  Administered 2021-07-03: 1000 mL

## 2021-07-03 MED ORDER — AMISULPRIDE (ANTIEMETIC) 5 MG/2ML IV SOLN: 10.0000 mg | Freq: Once | INTRAVENOUS | Status: DC | PRN

## 2021-07-03 MED ORDER — LIDOCAINE 2% (20 MG/ML) 5 ML SYRINGE
INTRAMUSCULAR | Status: DC | PRN
  Administered 2021-07-03: 60 mg via INTRAVENOUS

## 2021-07-03 MED ORDER — DEXMEDETOMIDINE (PRECEDEX) IN NS 20 MCG/5ML (4 MCG/ML) IV SYRINGE
PREFILLED_SYRINGE | INTRAVENOUS | Status: DC | PRN
  Administered 2021-07-03: 8 ug via INTRAVENOUS

## 2021-07-03 MED ORDER — ACETAMINOPHEN 500 MG PO TABS
1000.0000 mg | ORAL_TABLET | ORAL | Status: AC
  Administered 2021-07-03: 1000 mg via ORAL
  Filled 2021-07-03: qty 2

## 2021-07-03 MED ORDER — LACTATED RINGERS IV SOLN: INTRAVENOUS | Status: DC

## 2021-07-03 MED ORDER — BUPIVACAINE LIPOSOME 1.3 % IJ SUSP
INTRAMUSCULAR | Status: AC
  Filled 2021-07-03: qty 20

## 2021-07-03 MED ORDER — BUPIVACAINE-EPINEPHRINE 0.25% -1:200000 IJ SOLN
INTRAMUSCULAR | Status: DC | PRN
Start: 2021-07-03 — End: 2021-07-03
  Administered 2021-07-03: 10 mL

## 2021-07-03 MED ORDER — FENTANYL CITRATE (PF) 100 MCG/2ML IJ SOLN
INTRAMUSCULAR | Status: AC
  Filled 2021-07-03: qty 2

## 2021-07-03 MED ORDER — SUGAMMADEX SODIUM 500 MG/5ML IV SOLN
INTRAVENOUS | Status: DC | PRN
  Administered 2021-07-03: 400 mg via INTRAVENOUS

## 2021-07-03 SURGICAL SUPPLY — 47 items
ADH SKN CLS APL DERMABOND .7 (GAUZE/BANDAGES/DRESSINGS) ×1
BAG COUNTER SPONGE SURGICOUNT (BAG) ×1 IMPLANT
BAG SPNG CNTER NS LX DISP (BAG) ×1
BINDER ABDOMINAL 12 ML 46-62 (SOFTGOODS) ×1 IMPLANT
COVER SURGICAL LIGHT HANDLE (MISCELLANEOUS) ×2 IMPLANT
DERMABOND ADVANCED (GAUZE/BANDAGES/DRESSINGS) ×1
DERMABOND ADVANCED .7 DNX12 (GAUZE/BANDAGES/DRESSINGS) IMPLANT
DEVICE SECURE STRAP 25 ABSORB (INSTRUMENTS) ×1 IMPLANT
DEVICE TROCAR PUNCTURE CLOSURE (ENDOMECHANICALS) IMPLANT
DISSECTOR BLUNT TIP ENDO 5MM (MISCELLANEOUS) IMPLANT
DRAIN CHANNEL 19F RND (DRAIN) IMPLANT
ELECT REM PT RETURN 15FT ADLT (MISCELLANEOUS) ×2 IMPLANT
EVACUATOR SILICONE 100CC (DRAIN) IMPLANT
GLOVE SURG LX 8.0 MICRO (GLOVE) ×1
GLOVE SURG LX STRL 8.0 MICRO (GLOVE) ×1 IMPLANT
GOWN SPEC L4 XLG W/TWL (GOWN DISPOSABLE) ×2 IMPLANT
GOWN STRL REUS W/ TWL XL LVL3 (GOWN DISPOSABLE) ×2 IMPLANT
GOWN STRL REUS W/TWL XL LVL3 (GOWN DISPOSABLE) ×4
IRRIG SUCT STRYKERFLOW 2 WTIP (MISCELLANEOUS)
IRRIGATION SUCT STRKRFLW 2 WTP (MISCELLANEOUS) IMPLANT
KIT BASIN OR (CUSTOM PROCEDURE TRAY) ×2 IMPLANT
KIT TURNOVER KIT A (KITS) ×1 IMPLANT
MARKER SKIN DUAL TIP RULER LAB (MISCELLANEOUS) IMPLANT
MESH VENTRALEX ST 2.5 CRC MED (Mesh General) ×1 IMPLANT
NDL SPNL 22GX3.5 QUINCKE BK (NEEDLE) IMPLANT
NEEDLE SPNL 22GX3.5 QUINCKE BK (NEEDLE) IMPLANT
PAD POSITIONING PINK XL (MISCELLANEOUS) ×1 IMPLANT
PENCIL SMOKE EVACUATOR (MISCELLANEOUS) ×1 IMPLANT
PROTECTOR NERVE ULNAR (MISCELLANEOUS) ×1 IMPLANT
SCISSORS LAP 5X45 EPIX DISP (ENDOMECHANICALS) ×2 IMPLANT
SET TUBE SMOKE EVAC HIGH FLOW (TUBING) ×2 IMPLANT
SLEEVE Z-THREAD 5X100MM (TROCAR) ×2 IMPLANT
SPIKE FLUID TRANSFER (MISCELLANEOUS) ×2 IMPLANT
STAPLER VISISTAT 35W (STAPLE) IMPLANT
STRIP CLOSURE SKIN 1/2X4 (GAUZE/BANDAGES/DRESSINGS) IMPLANT
SUT MNCRL AB 4-0 PS2 18 (SUTURE) ×1 IMPLANT
SUT NOVA 0 T19/GS 22DT (SUTURE) ×1 IMPLANT
SUT NOVA NAB DX-16 0-1 5-0 T12 (SUTURE) ×2 IMPLANT
SUT NOVA NAB GS-21 0 18 T12 DT (SUTURE) IMPLANT
SUT VIC AB 4-0 SH 18 (SUTURE) ×2 IMPLANT
TACKER 5MM HERNIA 3.5CML NAB (ENDOMECHANICALS) IMPLANT
TOWEL OR 17X26 10 PK STRL BLUE (TOWEL DISPOSABLE) ×2 IMPLANT
TOWEL OR NON WOVEN STRL DISP B (DISPOSABLE) ×2 IMPLANT
TRAY FOLEY MTR SLVR 16FR STAT (SET/KITS/TRAYS/PACK) IMPLANT
TRAY LAPAROSCOPIC (CUSTOM PROCEDURE TRAY) ×2 IMPLANT
TROCAR XCEL NON-BLD 11X100MML (ENDOMECHANICALS) IMPLANT
TROCAR Z-THREAD OPTICAL 5X100M (TROCAR) ×2 IMPLANT

## 2021-07-03 NOTE — Transfer of Care (Signed)
Immediate Anesthesia Transfer of Care Note  Patient: [name]  Procedure(s) Performed: LAPAROSCOPIC SUPRAUMBILICAL HERNIA REPAIR  Patient Location: PACU  Anesthesia Type:General  Level of Consciousness: drowsy and patient cooperative  Airway & Oxygen Therapy: Patient Spontanous Breathing and Patient connected to face mask oxygen  Post-op Assessment: Report given to RN and Post -op Vital signs reviewed and stable  Post vital signs: Reviewed and stable  Last Vitals:  Vitals Value Taken Time  BP 151/90 07/03/21 1239  Temp    Pulse 73 07/03/21 1240  Resp 12 07/03/21 1240  SpO2 100 % 07/03/21 1240  Vitals shown include unvalidated device data.  Last Pain:  Vitals:   07/03/21 0922  TempSrc:   PainSc: 0-No pain         Complications: No notable events documented.

## 2021-07-03 NOTE — Anesthesia Procedure Notes (Signed)
Procedure Name: Intubation Date/Time: 07/03/2021 11:14 AM Performed by: West Pugh, CRNA Pre-anesthesia Checklist: Patient identified, Emergency Drugs available, Suction available, Patient being monitored and Timeout performed Patient Re-evaluated:Patient Re-evaluated prior to induction Oxygen Delivery Method: Circle system utilized Preoxygenation: Pre-oxygenation with 100% oxygen Induction Type: IV induction Ventilation: Mask ventilation without difficulty Laryngoscope Size: Mac and 4 Grade View: Grade I Tube type: Oral Tube size: 7.0 mm Number of attempts: 1 Airway Equipment and Method: Stylet Placement Confirmation: ETT inserted through vocal cords under direct vision, positive ETCO2, CO2 detector and breath sounds checked- equal and bilateral Secured at: 22 cm Tube secured with: Tape Dental Injury: Teeth and Oropharynx as per pre-operative assessment

## 2021-07-03 NOTE — Interval H&P Note (Signed)
History and Physical Interval Note:  07/03/2021 10:10 AM  Tanya Chapman  has presented today for surgery, with the diagnosis of SUPRAUMBILICAL HERNIA.  The various methods of treatment have been discussed with the patient and family. After consideration of risks, benefits and other options for treatment, the patient has consented to  Procedure(s): LAPAROSCOPIC SUPRAUMBILICAL HERNIA REPAIR (N/A) as a surgical intervention.  The patient's history has been reviewed, patient examined, no change in status, stable for surgery.  I have reviewed the patient's chart and labs.  Questions were answered to the patient's satisfaction.     Valarie Merino

## 2021-07-03 NOTE — Op Note (Signed)
Tanya Chapman  [birth year]   07/03/2021    PCP:  Hillery Aldo, PA-C   Surgeon: Wenda Low, MD, FACS  Asst:  Jolene Schimke, MD  Anes:  general  Preop Dx: Supraumbilical ventral hernia Postop Dx: Preperitoneal fat herniation through small fascial defect  Procedure: Laparoscopically assisted ventral hernia repair with 2.5 " Ventralex mesh patch Location Surgery: [facility] Complications:  None noted  EBL:   minimal cc  Drains: none  Description of Procedure:  The patient was taken to OR 2 .  After anesthesia was administered and the patient was prepped  with ChloraPrep and a timeout was performed.  Access to the abdomen was achieved with a 5 mm Optiview through the left upper quadrant.  With the ankle scope the defect was visualized.  Preoperatively marked this palpable mass above her umbilicus.  This proved to be a portion of the falciform that was herniating up through a small fascial defect.  We made a small long longitudinal incision overlying that and freed it up from the surrounding tissue and then amputated it.  This left about a 1.5 cm fascial defect that was oriented transversely.  We elected to place a 2.5 inch round ventral Lex mesh into this pulling it up and securing with 4 sutures of 0 Novafil in the undersurface of the mesh.  This appended nicely to the defect.  We then put a second port in in the right lower quadrant and used the secure strap to tack it on the perimeter both from that side and from the upper abdominal site.  We also approximated the fascia with 2 sutures of placed in a figure-of-eight fashion which further obliterated the the small defect.  The mesh seemed to have a good lot to it and there were no other abnormalities noted in the upper abdomen.  No bowel was involved in this.  A block was performed with Exparel that been diluted to 30 cc and this was done on either side and also the use of Marcaine on the midline incision.  It was closed with  Monocryl and she had Dermabond and then an abdominal binder was placed.  She was taken to recovery room.  The patient tolerated the procedure well and was taken to the PACU in stable condition.     Matt B. Daphine Deutscher, MD, [facility], [place] [phone]

## 2021-07-03 NOTE — Anesthesia Preprocedure Evaluation (Addendum)
Anesthesia Evaluation  Patient identified by MRN, date of birth, ID band Patient awake    Reviewed: Allergy & Precautions, NPO status , Patient's Chart, lab work & pertinent test results  Airway Mallampati: III  TM Distance: >3 FB Neck ROM: Full    Dental no notable dental hx.    Pulmonary neg pulmonary ROS,    Pulmonary exam normal breath sounds clear to auscultation       Cardiovascular negative cardio ROS Normal cardiovascular exam Rhythm:Regular Rate:Normal     Neuro/Psych  Headaches, PSYCHIATRIC DISORDERS Anxiety Depression    GI/Hepatic negative GI ROS, Neg liver ROS,   Endo/Other  Hypothyroidism Hyperthyroidism (s/p thyroidectomy) Morbid obesity  Renal/GU negative Renal ROS  negative genitourinary   Musculoskeletal negative musculoskeletal ROS (+)   Abdominal   Peds negative pediatric ROS (+)  Hematology negative hematology ROS (+)   Anesthesia Other Findings   Reproductive/Obstetrics negative OB ROS                            Anesthesia Physical Anesthesia Plan  ASA: 3  Anesthesia Plan: General   Post-op Pain Management:    Induction: Intravenous  PONV Risk Score and Plan: 3 and Treatment may vary due to age or medical condition, Scopolamine patch - Pre-op, Ondansetron, Dexamethasone and Midazolam  Airway Management Planned: Oral ETT  Additional Equipment: None  Intra-op Plan:   Post-operative Plan: Extubation in OR  Informed Consent: I have reviewed the patients History and Physical, chart, labs and discussed the procedure including the risks, benefits and alternatives for the proposed anesthesia with the patient or authorized representative who has indicated his/her understanding and acceptance.     Dental advisory given  Plan Discussed with: CRNA, Anesthesiologist and Surgeon  Anesthesia Plan Comments:         Anesthesia Quick Evaluation

## 2021-07-03 NOTE — H&P (Signed)
PROVIDER: Katha Cabal, MD  MRN: [id number] DOB: 09-19-1990  Chief Complaint: New Consultation (Abdominal wall hernia)  History of Present Illness: Tanya Chapman is a 31 y.o. female who is seen today as an office consultation  for evaluation of New Consultation (Abdominal wall hernia) .   She apparently had a umbilical hernia repair as a child but she knows nothing of that. She was seen at [facility] with abdominal mass which is increased in size was throbbing. In the supraumbilical region she does have a soft mass consistent with a preperitoneal fatty hernia. She indicates that she is always been somewhat constipated and I doubt that that this is related to it. No CT scan was performed. I think that this is likely properitoneal or it could contain omentum.  I described how to do this laparoscopically assisted going in and examined from the inside and cutting down on repairing the fascial defect with mesh. She understands that. She would like to go ahead and get this done soon. Its been hurting more the last couple months.  Review of Systems: See HPI as well for other ROS.  ROS   Medical History: Past Medical History:  Diagnosis Date   Thyroid disease   Patient Active Problem List  Diagnosis   Dysmenorrhea   Graves' disease   Thyroid goiter   Menorrhagia   Past Surgical History:  Procedure Laterality Date   CESAREAN SECTION   HERNIA REPAIR   THYROIDECTOMY TOTAL    No Known Allergies  Current Outpatient Medications on File Prior to Visit  Medication Sig Dispense Refill   levothyroxine (SYNTHROID) 175 MCG tablet Take by mouth   SPRINTEC, 28, 0.25-35 mg-mcg tablet Take 1 tablet by mouth once daily   No current facility-administered medications on file prior to visit.   Family History  Problem Relation Age of Onset   High blood pressure (Hypertension) Mother   High blood pressure (Hypertension) Father    Social History   Tobacco Use  Smoking  Status Never  Smokeless Tobacco Never    Social History   Socioeconomic History   Marital status: Single  Tobacco Use   Smoking status: Never   Smokeless tobacco: Never  Vaping Use   Vaping Use: Never used  Substance and Sexual Activity   Alcohol use: Yes   Drug use: Never   Objective:   Vitals:  05/13/21 0927  Pulse: 74  Temp: 36.7 C (98.1 F)  SpO2: 96%  Weight: 96.7 kg (213 lb 3.2 oz)  Height: 162.6 cm (5\' 4" )   Body mass index is 36.6 kg/m.  Physical Exam General: Well-developed well-nourished African-American female no acute distress HEENT : May be slight proptosis because she has had Graves' disease in the past when she was in her early 42s she had a total thyroidectomy and takes Synthroid Chest: Clear Heart: Sinus rhythm without murmurs Breast: Not examined Abdomen: Tender soft mass above the umbilicus best seen when she stands up and bears down. Its immediately above the umbilicus in the midline GU not performed Rectal not performed Extremities full range of motion Neuro alert and oriented x3. Motor and sensory function grossly intact  Labs, Imaging and Diagnostic Testing: None to review  Assessment and Plan:  Diagnoses and all orders for this visit:  Supraumbilical hernia  I have discussed this repair with her again and will proceed with a lap assisted ventral hernia repair.    Glendoris Nodarse 38s, MD

## 2021-07-03 NOTE — Anesthesia Postprocedure Evaluation (Signed)
Anesthesia Post Note  Patient: Tanya Chapman  Procedure(s) Performed: LAPAROSCOPIC SUPRAUMBILICAL HERNIA REPAIR     Patient location during evaluation: PACU Anesthesia Type: General Level of consciousness: awake Pain management: pain level controlled Vital Signs Assessment: post-procedure vital signs reviewed and stable Respiratory status: spontaneous breathing and respiratory function stable Cardiovascular status: stable Postop Assessment: no apparent nausea or vomiting Anesthetic complications: no   No notable events documented.  Last Vitals:  Vitals:   07/03/21 1330 07/03/21 1345  BP: 126/82 133/89  Pulse: 70 74  Resp: 14 14  Temp: 36.6 C 36.6 C  SpO2: 95% 97%    Last Pain:  Vitals:   07/03/21 1345  TempSrc:   PainSc: 3                  Candra R Adelina Collard

## 2021-07-07 ENCOUNTER — Encounter (HOSPITAL_COMMUNITY): Payer: Self-pay | Admitting: Surgery

## 2021-07-08 ENCOUNTER — Encounter (HOSPITAL_COMMUNITY): Payer: Self-pay | Admitting: Surgery

## 2021-09-16 ENCOUNTER — Other Ambulatory Visit: Payer: Self-pay | Admitting: Endocrinology

## 2021-09-16 DIAGNOSIS — E890 Postprocedural hypothyroidism: Secondary | ICD-10-CM

## 2021-10-19 ENCOUNTER — Emergency Department (HOSPITAL_BASED_OUTPATIENT_CLINIC_OR_DEPARTMENT_OTHER)
Admission: EM | Admit: 2021-10-19 | Discharge: 2021-10-19 | Disposition: A | Discharge status: Home / Self Care | Payer: Self-pay | Attending: Emergency Medicine | Admitting: Emergency Medicine

## 2021-10-19 ENCOUNTER — Other Ambulatory Visit: Payer: Self-pay

## 2021-10-19 ENCOUNTER — Encounter (HOSPITAL_BASED_OUTPATIENT_CLINIC_OR_DEPARTMENT_OTHER): Payer: Self-pay | Admitting: Emergency Medicine

## 2021-10-19 ENCOUNTER — Emergency Department (HOSPITAL_BASED_OUTPATIENT_CLINIC_OR_DEPARTMENT_OTHER): Payer: Self-pay

## 2021-10-19 DIAGNOSIS — R1012 Left upper quadrant pain: Secondary | ICD-10-CM

## 2021-10-19 DIAGNOSIS — I517 Cardiomegaly: Secondary | ICD-10-CM | POA: Insufficient documentation

## 2021-10-19 LAB — CBC WITH DIFFERENTIAL/PLATELET
Abs Immature Granulocytes: 0.02 10*3/uL (ref 0.00–0.07)
Basophils Absolute: 0.1 10*3/uL (ref 0.0–0.1)
Basophils Relative: 1 %
Eosinophils Absolute: 0.2 10*3/uL (ref 0.0–0.5)
Eosinophils Relative: 4 %
HCT: 38.5 % (ref 36.0–46.0)
Hemoglobin: 13.3 g/dL (ref 12.0–15.0)
Immature Granulocytes: 0 %
Lymphocytes Relative: 41 %
Lymphs Abs: 2.3 10*3/uL (ref 0.7–4.0)
MCH: 29.4 pg (ref 26.0–34.0)
MCHC: 34.5 g/dL (ref 30.0–36.0)
MCV: 85.2 fL (ref 80.0–100.0)
Monocytes Absolute: 0.4 10*3/uL (ref 0.1–1.0)
Monocytes Relative: 6 %
Neutro Abs: 2.7 10*3/uL (ref 1.7–7.7)
Neutrophils Relative %: 48 %
Platelets: 372 10*3/uL (ref 150–400)
RBC: 4.52 MIL/uL (ref 3.87–5.11)
RDW: 13.5 % (ref 11.5–15.5)
WBC: 5.7 10*3/uL (ref 4.0–10.5)
nRBC: 0 % (ref 0.0–0.2)

## 2021-10-19 LAB — URINALYSIS, ROUTINE W REFLEX MICROSCOPIC
Bilirubin Urine: NEGATIVE
Glucose, UA: NEGATIVE mg/dL
Ketones, ur: NEGATIVE mg/dL
Leukocytes,Ua: NEGATIVE
Nitrite: NEGATIVE
Protein, ur: 30 mg/dL — AB
Specific Gravity, Urine: 1.02 (ref 1.005–1.030)
pH: 6.5 (ref 5.0–8.0)

## 2021-10-19 LAB — COMPREHENSIVE METABOLIC PANEL
ALT: 17 U/L (ref 0–44)
AST: 27 U/L (ref 15–41)
Albumin: 4.4 g/dL (ref 3.5–5.0)
Alkaline Phosphatase: 71 U/L (ref 38–126)
Anion gap: 7 (ref 5–15)
BUN: 7 mg/dL (ref 6–20)
CO2: 29 mmol/L (ref 22–32)
Calcium: 8.9 mg/dL (ref 8.9–10.3)
Chloride: 102 mmol/L (ref 98–111)
Creatinine, Ser: 1.2 mg/dL — ABNORMAL HIGH (ref 0.44–1.00)
GFR, Estimated: >60 mL/min (ref >60)
Glucose, Bld: 92 mg/dL (ref 70–99)
Potassium: 3.6 mmol/L (ref 3.5–5.1)
Sodium: 138 mmol/L (ref 135–145)
Total Bilirubin: 0.9 mg/dL (ref 0.3–1.2)
Total Protein: 8.4 g/dL — ABNORMAL HIGH (ref 6.5–8.1)

## 2021-10-19 LAB — PREGNANCY, URINE: Preg Test, Ur: NEGATIVE

## 2021-10-19 LAB — URINALYSIS, MICROSCOPIC (REFLEX)
Bacteria, UA: NONE SEEN
RBC / HPF: >50 RBC/hpf (ref 0–5)

## 2021-10-19 LAB — LIPASE, BLOOD: Lipase: 34 U/L (ref 11–51)

## 2021-10-19 MED ORDER — DICYCLOMINE HCL 20 MG PO TABS: 20.0000 mg | ORAL_TABLET | Freq: Two times a day (BID) | ORAL | 0 refills | Status: DC

## 2021-10-19 MED ORDER — SODIUM CHLORIDE 0.9 % IV BOLUS
1000.0000 mL | Freq: Once | INTRAVENOUS | Status: AC
  Administered 2021-10-19: 1000 mL via INTRAVENOUS

## 2021-10-19 MED ORDER — IOHEXOL 300 MG/ML  SOLN
100.0000 mL | Freq: Once | INTRAMUSCULAR | Status: AC | PRN
  Administered 2021-10-19: 100 mL via INTRAVENOUS

## 2021-10-19 NOTE — ED Triage Notes (Signed)
Left sided abdominal pain x 1 month. Pain is constant and sharp. Denies n/v/d, fever.   Also requesting thyroid labs and a refill on her synthroid. Unable to see pcp until next month and out of meds.

## 2021-10-19 NOTE — ED Provider Notes (Signed)
[facility] EMERGENCY DEPARTMENT Provider Note   CSN: [id number] Arrival date & time: 10/19/21  1719    History  Chief Complaint  Patient presents with   Abdominal Pain   Medication Refill    Tanya Chapman is a 31 y.o. female here for evaluation of left-sided abdominal pain.  States has been going on over the last 2 to 3 months.  Pain is constant.  No changes in her bowel movements.  No melena or bright blood per rectum. Tylenol and Ibuprofen for sx. On menstrual cycle, no vag d/c, itching, hematuria.  HPI     Home Medications Prior to Admission medications   Medication Sig Start Date End Date Taking? Authorizing Provider  dicyclomine (BENTYL) 20 MG tablet Take 1 tablet (20 mg total) by mouth 2 (two) times daily. 10/19/21  Yes Marcella Charlson A, PA-C  Ferrous Fumarate (IRON) 18 MG TBCR Take 72 mg by mouth daily.    [provider]  HYDROcodone-acetaminophen (NORCO/VICODIN) 5-325 MG tablet Take 1 tablet by mouth every 6 (six) hours as needed for moderate pain. 07/03/21   Luretha Murphy, MD  levothyroxine (SYNTHROID) 200 MCG tablet Take 1 tablet (200 mcg total) by mouth daily before breakfast. 06/08/21   Reather Littler, MD  lisinopril (ZESTRIL) 20 MG tablet Take 20 mg by mouth daily.    [provider]  SPRINTEC 28 0.25-35 MG-MCG tablet Take 1 tablet by mouth daily. 06/03/21   [provider]      Allergies    Depo-provera [medroxyprogesterone acetate] and Doxycycline    Review of Systems   Review of Systems  Constitutional: Negative.   HENT: Negative.    Respiratory: Negative.    Cardiovascular: Negative.   Gastrointestinal:  Positive for abdominal pain. Negative for abdominal distention, anal bleeding, blood in stool, constipation, diarrhea, nausea, rectal pain and vomiting.  Genitourinary: Negative.   Musculoskeletal: Negative.   Skin: Negative.   Neurological: Negative.   All other systems reviewed and are negative.   Physical  Exam Updated Vital Signs BP (!) 137/96 (BP Location: Left Arm)   Pulse 61   Temp 98.4 F (36.9 C) (Oral)   Resp 17   Ht 5\' 4"  (1.626 m)   Wt 96.6 kg   LMP 10/15/2021 (Exact Date)   SpO2 100%   BMI 36.56 kg/m  Physical Exam Vitals and nursing note reviewed.  Constitutional:      General: She is not in acute distress.    Appearance: She is well-developed. She is not ill-appearing, toxic-appearing or diaphoretic.  HENT:     Head: Atraumatic.  Eyes:     Pupils: Pupils are equal, round, and reactive to light.  Cardiovascular:     Rate and Rhythm: Normal rate.     Pulses: Normal pulses.          Radial pulses are 2+ on the right side and 2+ on the left side.       Dorsalis pedis pulses are 2+ on the right side and 2+ on the left side.     Heart sounds: Normal heart sounds.  Pulmonary:     Effort: Pulmonary effort is normal. No respiratory distress.     Breath sounds: Normal breath sounds.  Abdominal:     General: There is no distension.     Palpations: Abdomen is soft.     Tenderness: There is abdominal tenderness in the left upper quadrant and left lower quadrant. There is no right CVA tenderness, left CVA tenderness, guarding  or rebound. Negative signs include Murphy's sign and McBurney's sign.  Musculoskeletal:        General: Normal range of motion.     Cervical back: Normal range of motion.  Skin:    General: Skin is warm and dry.  Neurological:     General: No focal deficit present.     Mental Status: She is alert.  Psychiatric:        Mood and Affect: Mood normal.     ED Results / Procedures / Treatments   Labs (all labs ordered are listed, but only abnormal results are displayed) Labs Reviewed  COMPREHENSIVE METABOLIC PANEL - Abnormal; Notable for the following components:      Result Value   Creatinine, Ser 1.20 (*)    Total Protein 8.4 (*)    All other components within normal limits  URINALYSIS, ROUTINE W REFLEX MICROSCOPIC - Abnormal; Notable for the  following components:   Color, Urine ORANGE (*)    APPearance CLOUDY (*)    Hgb urine dipstick LARGE (*)    Protein, ur 30 (*)    All other components within normal limits  LIPASE, BLOOD  CBC WITH DIFFERENTIAL/PLATELET  PREGNANCY, URINE  URINALYSIS, MICROSCOPIC (REFLEX)  PREGNANCY, URINE    EKG None  Radiology CT ABDOMEN PELVIS W CONTRAST  Result Date: 10/19/2021 CLINICAL DATA:  Left upper quadrant abdominal pain for 1 month. EXAM: CT ABDOMEN AND PELVIS WITH CONTRAST TECHNIQUE: Multidetector CT imaging of the abdomen and pelvis was performed using the standard protocol following bolus administration of intravenous contrast. RADIATION DOSE REDUCTION: This exam was performed according to the departmental dose-optimization program which includes automated exposure control, adjustment of the mA and/or kV according to patient size and/or use of iterative reconstruction technique. CONTRAST:  OMNIPAQUE IOHEXOL 300 MG/ML  SOLN COMPARISON:  None Available. FINDINGS: Lower chest: Mild cardiomegaly. Trace anterior pericardial of fusion. Trace bilateral pleural effusions, right greater than left. Hepatobiliary: 6 by 4 mm hypodense lesion in the dome of the liver on image 10 series 2, likely benign/incidental but technically nonspecific due to small size. Gallbladder unremarkable. Pancreas: Unremarkable Spleen: Unremarkable Adrenals/Urinary Tract: Unremarkable Stomach/Bowel: Unremarkable Vascular/Lymphatic: Unremarkable Reproductive: Unremarkable Other: No supplemental non-categorized findings. Musculoskeletal: Umbilical ventral hernia mesh noted with overlying scarring. IMPRESSION: 1. A specific cause for the patient's left upper quadrant pain is not identified on today's imaging exam. 2. Mild cardiomegaly with trace bilateral pleural effusions and a trace anterior pericardial effusion. 3. Umbilical ventral hernia mesh noted with overlying subcutaneous scarring. Electronically Signed   By: Gaylyn Rong M.D.   On: 10/19/2021 21:07    Procedures Procedures    Medications Ordered in ED Medications  sodium chloride 0.9 % bolus 1,000 mL (0 mLs Intravenous Stopped 10/19/21 2252)  iohexol (OMNIPAQUE) 300 MG/ML solution 100 mL (100 mLs Intravenous Contrast Given 10/19/21 2044)    ED Course/ Medical Decision Making/ A&P    31 year old here for evaluation of left-sided abdominal pain.  Diffuse in nature.  Has been going on over the last 2 months.  Pain chronic in nature.  No urinary symptoms.  No change in bowel movements.  No melena or per rectum.  No chest pain, shortness of breath.  No concern for STD, no pelvic pain  Labs and imaging personally viewed and interpreted: CBC without leukocytosis Lipase 34 UA with blood however on menstrual cycle Metabolic panel creatinine 1.2 CT scan abdomen pelvis without acute abdominal pathology however does have cardiomegaly small bilateral pleural effusions  I  discussed results with patient in room.  Unclear etiology of her abdominal pain.  Will start on Bentyl and have her follow-up with gastroenterology.  I did discuss her cardiomegaly.  No current chest pain, shortness of breath.  Does admit to intermittent swelling to her bilateral feet however none currently.  She has no history of heart failure.  She does admit to noncompliance with her blood pressure medication.  We will have her follow-up with her PCP will possibly need echocardiogram.  She is not hypoxic.  Encourage close follow-up return for new or worsening symptoms  The patient has been appropriately medically screened and/or stabilized in the ED. I have low suspicion for any other emergent medical condition which would require further screening, evaluation or treatment in the ED or require inpatient management.  Patient is hemodynamically stable and in no acute distress.  Patient able to ambulate in department prior to ED.  Evaluation does not show acute pathology that would require  ongoing or additional emergent interventions while in the emergency department or further inpatient treatment.  I have discussed the diagnosis with the patient and answered all questions.  Pain is been managed while in the emergency department and patient has no further complaints prior to discharge.  Patient is comfortable with plan discussed in room and is stable for discharge at this time.  I have discussed strict return precautions for returning to the emergency department.  Patient was encouraged to follow-up with PCP/specialist refer to at discharge.                            Medical Decision Making Amount and/or Complexity of Data Reviewed External Data Reviewed: labs, radiology, ECG and notes. Labs: ordered. Decision-making details documented in ED Course. Radiology: ordered and independent interpretation performed. Decision-making details documented in ED Course. ECG/medicine tests: ordered and independent interpretation performed. Decision-making details documented in ED Course.  Risk OTC drugs. Prescription drug management. Decision regarding hospitalization. Diagnosis or treatment significantly limited by social determinants of health.          Final Clinical Impression(s) / ED Diagnoses Final diagnoses:  Left upper quadrant abdominal pain  Cardiomegaly    Rx / DC Orders ED Discharge Orders          Ordered    dicyclomine (BENTYL) 20 MG tablet  2 times daily        10/19/21 2249    Ambulatory referral to Gastroenterology       Comments: Chronic abd pain   10/19/21 2249              Keona Bilyeu A, PA-C 10/19/21 2253    Tegeler, Canary Brim, MD 10/19/21 [phone]

## 2021-10-19 NOTE — ED Notes (Signed)
Patient transported to CT 

## 2021-10-19 NOTE — Discharge Instructions (Signed)
I have written you for some medication to help with your stomach pain.  Make sure to follow-up with your primary care provider regarding your enlarged heart

## 2021-11-29 ENCOUNTER — Inpatient Hospital Stay (HOSPITAL_COMMUNITY): Payer: Self-pay

## 2021-11-29 ENCOUNTER — Encounter (HOSPITAL_COMMUNITY): Payer: Self-pay | Admitting: *Deleted

## 2021-11-29 ENCOUNTER — Inpatient Hospital Stay (HOSPITAL_COMMUNITY)
Admission: AD | Admit: 2021-11-29 | Discharge: 2021-11-29 | Disposition: A | Discharge status: Home / Self Care | Payer: Self-pay | Attending: Family Medicine | Admitting: Family Medicine

## 2021-11-29 DIAGNOSIS — O200 Threatened abortion: Secondary | ICD-10-CM | POA: Insufficient documentation

## 2021-11-29 DIAGNOSIS — Z3A01 Less than 8 weeks gestation of pregnancy: Secondary | ICD-10-CM

## 2021-11-29 DIAGNOSIS — Z79899 Other long term (current) drug therapy: Secondary | ICD-10-CM | POA: Insufficient documentation

## 2021-11-29 DIAGNOSIS — O10911 Unspecified pre-existing hypertension complicating pregnancy, first trimester: Secondary | ICD-10-CM | POA: Insufficient documentation

## 2021-11-29 LAB — CBC
HCT: 33.6 % — ABNORMAL LOW (ref 36.0–46.0)
Hemoglobin: 11.5 g/dL — ABNORMAL LOW (ref 12.0–15.0)
MCH: 29.9 pg (ref 26.0–34.0)
MCHC: 34.2 g/dL (ref 30.0–36.0)
MCV: 87.5 fL (ref 80.0–100.0)
Platelets: 324 10*3/uL (ref 150–400)
RBC: 3.84 MIL/uL — ABNORMAL LOW (ref 3.87–5.11)
RDW: 12.6 % (ref 11.5–15.5)
WBC: 6.3 10*3/uL (ref 4.0–10.5)
nRBC: 0 % (ref 0.0–0.2)

## 2021-11-29 LAB — URINALYSIS, ROUTINE W REFLEX MICROSCOPIC
Bilirubin Urine: NEGATIVE
Glucose, UA: NEGATIVE mg/dL
Ketones, ur: NEGATIVE mg/dL
Leukocytes,Ua: NEGATIVE
Nitrite: NEGATIVE
Protein, ur: NEGATIVE mg/dL
Specific Gravity, Urine: 1.02 (ref 1.005–1.030)
pH: 5.5 (ref 5.0–8.0)

## 2021-11-29 LAB — PROTEIN / CREATININE RATIO, URINE
Creatinine, Urine: 122 mg/dL
Total Protein, Urine: <6 mg/dL

## 2021-11-29 LAB — COMPREHENSIVE METABOLIC PANEL
ALT: 15 U/L (ref 0–44)
AST: 15 U/L (ref 15–41)
Albumin: 3.8 g/dL (ref 3.5–5.0)
Alkaline Phosphatase: 63 U/L (ref 38–126)
Anion gap: 7 (ref 5–15)
BUN: 10 mg/dL (ref 6–20)
CO2: 26 mmol/L (ref 22–32)
Calcium: 9 mg/dL (ref 8.9–10.3)
Chloride: 105 mmol/L (ref 98–111)
Creatinine, Ser: 0.83 mg/dL (ref 0.44–1.00)
GFR, Estimated: >60 mL/min (ref >60)
Glucose, Bld: 86 mg/dL (ref 70–99)
Potassium: 3.5 mmol/L (ref 3.5–5.1)
Sodium: 138 mmol/L (ref 135–145)
Total Bilirubin: 0.3 mg/dL (ref 0.3–1.2)
Total Protein: 6.8 g/dL (ref 6.5–8.1)

## 2021-11-29 LAB — URINALYSIS, MICROSCOPIC (REFLEX): RBC / HPF: NONE SEEN RBC/hpf (ref 0–5)

## 2021-11-29 LAB — POCT PREGNANCY, URINE: Preg Test, Ur: POSITIVE — AB

## 2021-11-29 LAB — HCG, QUANTITATIVE, PREGNANCY: hCG, Beta Chain, Quant, S: 1120 m[IU]/mL — ABNORMAL HIGH (ref <5)

## 2021-11-29 MED ORDER — LABETALOL HCL 100 MG PO TABS: 100.0000 mg | ORAL_TABLET | Freq: Two times a day (BID) | ORAL | 0 refills | Status: DC

## 2021-11-29 NOTE — Discharge Instructions (Signed)
Return to care  If you have heavier bleeding that soaks through more than 2 pads per hour for an hour or more If you bleed so much that you feel like you might pass out or you do pass out If you have significant abdominal pain that is not improved with Tylenol   

## 2021-11-29 NOTE — MAU Note (Signed)
.  Tanya Chapman is a 31 y.o. at Unknown here in MAU reporting: she had some brown mucusy discharge yesterday and it is more bloody to day. Also c/o mild abd cramping. Intercourse about 5 days. LMP: 10/13/21 Onset of complaint: yesterday Pain score: 5 Vitals:   11/29/21 1726  BP: (!) 135/92  Pulse: 83  Resp: 18  Temp: 98.6 F (37 C)     FHT: Lab orders placed from triage:  upt.U/A

## 2021-11-29 NOTE — MAU Provider Note (Cosign Needed Addendum)
History     CSN: [id number]  Arrival date and time: 11/29/21 1657   Event Date/Time   First Provider Initiated Contact with Patient 11/29/21 1751      Chief Complaint  Patient presents with   Vaginal Bleeding   Abdominal Pain   Tanya Chapman is a 31 y.o. [id number] at [redacted]w[redacted]d who presents today with cramping and bleeding. Patient has CHTN, and was on lisinopril and she stopped this about 2 weeks ago. She was unable to get a refill because she didn't have insurance to be able to go to her appt and she was out of refills.   Vaginal Bleeding The patient's primary symptoms include pelvic pain and vaginal bleeding. This is a new problem. The current episode started today. The problem occurs intermittently. The problem has been unchanged. The problem affects the left side. The vaginal discharge was bloody. The vaginal bleeding is typical of menses. She has not been passing clots. She has not been passing tissue. Nothing aggravates the symptoms. She has tried nothing for the symptoms. Her menstrual history has been irregular (LMP 10/13/2021).    OB History     Gravida  8   Para  2   Term  2   Preterm  0   AB  4   Living  2      SAB  0   IAB  4   Ectopic  0   Multiple  0   Live Births  2           Past Medical History:  Diagnosis Date   Anxiety    Depression    Fatigue    Generalized headaches    migraines and generalized    Hyperthyroidism    MRSA carrier    Thyroid disease    hyperthyroid    Trouble swallowing    Visual disturbance    eyes bulging, blurry, and hurting    Weight loss, unintentional     Past Surgical History:  Procedure Laterality Date   CESAREAN SECTION  11/22/08   HERNIA REPAIR  1996   nexplanon  01/23/2015   Nexplanon removed   TOTAL THYROIDECTOMY  11/26/10   UMBILICAL HERNIA REPAIR N/A 07/03/2021   Procedure: LAPAROSCOPIC SUPRAUMBILICAL HERNIA REPAIR;  Surgeon: Luretha Murphy, MD;  Location: [facility];  Service: General;   Laterality: N/A;    Family History  Problem Relation Age of Onset   Migraines Mother    Migraines Brother    Diabetes Maternal Grandmother    Hypertension Maternal Grandmother    Hypertension Maternal Grandfather    Stroke Maternal Grandfather    Breast cancer Paternal Grandmother    Diabetes Paternal Grandmother    Thyroid disease Paternal Grandmother    Hypertension Paternal Grandfather     Social History   Tobacco Use   Smoking status: Never   Smokeless tobacco: Never  Vaping Use   Vaping Use: Never used  Substance Use Topics   Alcohol use: Not Currently    Comment: occ   Drug use: No    Allergies:  Allergies  Allergen Reactions   Depo-Provera [Medroxyprogesterone Acetate]     Weight gain    Doxycycline Rash    Medications Prior to Admission  Medication Sig Dispense Refill Last Dose   Ferrous Fumarate (IRON) 18 MG TBCR Take 72 mg by mouth daily.   Past Month   levothyroxine (SYNTHROID) 200 MCG tablet Take 1 tablet (200 mcg total) by mouth daily before breakfast. 30 tablet 0  11/28/2021   lisinopril (ZESTRIL) 20 MG tablet Take 20 mg by mouth daily.   Past Month   dicyclomine (BENTYL) 20 MG tablet Take 1 tablet (20 mg total) by mouth 2 (two) times daily. 20 tablet 0 Unknown    Review of Systems  Genitourinary:  Positive for pelvic pain and vaginal bleeding.  All other systems reviewed and are negative.  Physical Exam   Blood pressure 139/82, pulse 73, temperature 98.6 F (37 C), resp. rate 18, height 5\' 4"  (1.626 m), weight 97.1 kg, last menstrual period 10/13/2021, SpO2 100 %, unknown if currently breastfeeding.  Physical Exam Constitutional:      Appearance: She is well-developed.  HENT:     Head: Normocephalic.  Eyes:     Pupils: Pupils are equal, round, and reactive to light.  Cardiovascular:     Rate and Rhythm: Normal rate and regular rhythm.     Heart sounds: Normal heart sounds.  Pulmonary:     Effort: Pulmonary effort is normal. No  respiratory distress.     Breath sounds: Normal breath sounds.  Abdominal:     Palpations: Abdomen is soft.     Tenderness: There is no abdominal tenderness.  Genitourinary:    Vagina: No bleeding. Vaginal discharge: mucusy.    Comments: External: no lesion Vagina: small amount of white discharge     Musculoskeletal:        General: Normal range of motion.     Cervical back: Normal range of motion and neck supple.  Skin:    General: Skin is warm and dry.  Neurological:     Mental Status: She is alert and oriented to person, place, and time.  Psychiatric:        Mood and Affect: Mood normal.        Behavior: Behavior normal.     Results for orders placed or performed during the hospital encounter of 11/29/21 (from the past 24 hour(s))  Pregnancy, urine POC     Status: Abnormal   Collection Time: 11/29/21  5:14 PM  Result Value Ref Range   Preg Test, Ur POSITIVE (A) NEGATIVE  Urinalysis, Routine w reflex microscopic Urine, Clean Catch     Status: Abnormal   Collection Time: 11/29/21  5:19 PM  Result Value Ref Range   Color, Urine YELLOW YELLOW   APPearance CLEAR CLEAR   Specific Gravity, Urine 1.020 1.005 - 1.030   pH 5.5 5.0 - 8.0   Glucose, UA NEGATIVE NEGATIVE mg/dL   Hgb urine dipstick LARGE (A) NEGATIVE   Bilirubin Urine NEGATIVE NEGATIVE   Ketones, ur NEGATIVE NEGATIVE mg/dL   Protein, ur NEGATIVE NEGATIVE mg/dL   Nitrite NEGATIVE NEGATIVE   Leukocytes,Ua NEGATIVE NEGATIVE  Protein / creatinine ratio, urine     Status: None   Collection Time: 11/29/21  5:19 PM  Result Value Ref Range   Creatinine, Urine 122 mg/dL   Total Protein, Urine <6 mg/dL   Protein Creatinine Ratio        0.00 - 0.15 mg/mg[Cre]  Urinalysis, Microscopic (reflex)     Status: Abnormal   Collection Time: 11/29/21  5:19 PM  Result Value Ref Range   RBC / HPF NONE SEEN 0 - 5 RBC/hpf   WBC, UA 0-5 0 - 5 WBC/hpf   Bacteria, UA RARE (A) NONE SEEN   Squamous Epithelial / LPF 0-5 0 - 5  CBC      Status: Abnormal   Collection Time: 11/29/21  6:17 PM  Result Value Ref  Range   WBC 6.3 4.0 - 10.5 K/uL   RBC 3.84 (L) 3.87 - 5.11 MIL/uL   Hemoglobin 11.5 (L) 12.0 - 15.0 g/dL   HCT 33.6 (L) 36.0 - 46.0 %   MCV 87.5 80.0 - 100.0 fL   MCH 29.9 26.0 - 34.0 pg   MCHC 34.2 30.0 - 36.0 g/dL   RDW 12.6 11.5 - 15.5 %   Platelets 324 150 - 400 K/uL   nRBC 0.0 0.0 - 0.2 %  hCG, quantitative, pregnancy     Status: Abnormal   Collection Time: 11/29/21  6:17 PM  Result Value Ref Range   hCG, Beta Chain, Quant, S 1,120 (H) <5 mIU/mL  Comprehensive metabolic panel     Status: None   Collection Time: 11/29/21  6:17 PM  Result Value Ref Range   Sodium 138 135 - 145 mmol/L   Potassium 3.5 3.5 - 5.1 mmol/L   Chloride 105 98 - 111 mmol/L   CO2 26 22 - 32 mmol/L   Glucose, Bld 86 70 - 99 mg/dL   BUN 10 6 - 20 mg/dL   Creatinine, Ser 0.83 0.44 - 1.00 mg/dL   Calcium 9.0 8.9 - 10.3 mg/dL   Total Protein 6.8 6.5 - 8.1 g/dL   Albumin 3.8 3.5 - 5.0 g/dL   AST 15 15 - 41 U/L   ALT 15 0 - 44 U/L   Alkaline Phosphatase 63 38 - 126 U/L   Total Bilirubin 0.3 0.3 - 1.2 mg/dL   GFR, Estimated >60 >60 mL/min   Anion gap 7 5 - 15    US OB LESS THAN 14 WEEKS WITH OB TRANSVAGINAL  Result Date: 11/29/2021 CLINICAL DATA:  [id number] Vaginal bleeding [id number] Pelvic pain affecting pregnancy in first trimester, antepartum [id number]. Gestational age by last menstrual period of 6 weeks and 5 days. Estimated due date by last menstrual period 07/20/2022. Last menstrual. Unsure possible around 10/13/2021. EXAM: OBSTETRIC <14 WK [place] AND TRANSVAGINAL OB US TECHNIQUE: Both transabdominal and transvaginal ultrasound examinations were performed for complete evaluation of the gestation as well as the maternal uterus, adnexal regions, and pelvic cul-de-sac. Transvaginal technique was performed to assess early pregnancy. COMPARISON:  None Available. FINDINGS: Intrauterine gestational sac: Single located within the lower uterine  segment. Yolk sac:  Not Visualized. Embryo:  Not Visualized. Cardiac Activity: Not Visualized. MSD: 5.9 mm   5 w   2 d Subchorionic hemorrhage:  None visualized. Maternal uterus/adnexae: Bilateral res are unremarkable. Corpus luteum cyst within the left ovary. Other: Trace free fluid within the pelvis. IMPRESSION: Probable early intrauterine gestational sac located within the lower uterine segment, but no yolk sac, fetal pole, or cardiac activity yet visualized. Recommend follow-up quantitative B-HCG levels and follow-up US in 14 days to assess viability. This recommendation follows SRU consensus guidelines: Diagnostic Criteria for Nonviable Pregnancy Early in the First Trimester. [employer] Med 2013; 132:4401-02. Electronically Signed   By: Iven Finn M.D.   On: 11/29/2021 20:11     MAU Course  Procedures  MDM 2008: Care turned over to Jorje Guild, [place] results pending.  Will need RX for BP meds at DC and a goodrx coupon   Marcille Buffy DNP, CNM  11/29/21  8:03 PM   Ultrasound shows IUGS in lower uterine segment - u/a images reviewed by Dr. Nelda Marseille.  Will get HCG in office on Wednesday to see if significantly dropping to indicate miscarriage. Otherwise, patient can follow up for viability scan in 10-14 days.  She is RH positive  Patient needs refill for antihypertensive. Will start on labetalol.  Assessment and Plan   1. Threatened miscarriage  -Reviewed bleeding precautions & reasons to return to MAU -Scheduled for stat HCG on Wednesday. If SAB not obvious will get viability scan in 10-14 days  2. Chronic hypertension complicating or reason for care during pregnancy, first trimester  -Rx labetalol 100 BID. Given good rx coupon  3. [redacted] weeks gestation of pregnancy    Judeth Horn, NP

## 2021-12-02 ENCOUNTER — Ambulatory Visit: Payer: Self-pay

## 2021-12-02 NOTE — Progress Notes (Deleted)
Beta HCG Follow-up Visit  Tanya Chapman presents to [facility] for follow-up beta HCG lab. She was seen in MAU for pelvic pain and vaginal bleeding on . {Blank single:19197::"abdominal pain","vaginal bleeding"} on ***. Patient {Actions; denies-reports:120008} {pain/bleeding:25812} today. Discussed with patient that we are following beta HCG levels today. Results will be back in approximately 2 hours. Valid contact number for patient confirmed. I will call the patient with results.   Beta HCG results: 11/29/21 1817 1120  12/02/21     Results and patient history reviewed with ***, who states ***. Patient called and informed of plan for follow-up.  Annabell Howells 12/02/2021 8:38 AM

## 2021-12-28 ENCOUNTER — Emergency Department (HOSPITAL_BASED_OUTPATIENT_CLINIC_OR_DEPARTMENT_OTHER)
Admission: EM | Admit: 2021-12-28 | Discharge: 2021-12-28 | Disposition: A | Discharge status: Home / Self Care | Payer: Self-pay | Attending: Emergency Medicine | Admitting: Emergency Medicine

## 2021-12-28 ENCOUNTER — Emergency Department (HOSPITAL_BASED_OUTPATIENT_CLINIC_OR_DEPARTMENT_OTHER): Payer: Self-pay | Admitting: Radiology

## 2021-12-28 ENCOUNTER — Encounter (HOSPITAL_BASED_OUTPATIENT_CLINIC_OR_DEPARTMENT_OTHER): Payer: Self-pay | Admitting: Emergency Medicine

## 2021-12-28 ENCOUNTER — Other Ambulatory Visit: Payer: Self-pay

## 2021-12-28 ENCOUNTER — Emergency Department (HOSPITAL_BASED_OUTPATIENT_CLINIC_OR_DEPARTMENT_OTHER): Payer: Self-pay

## 2021-12-28 DIAGNOSIS — R079 Chest pain, unspecified: Secondary | ICD-10-CM

## 2021-12-28 DIAGNOSIS — R0789 Other chest pain: Secondary | ICD-10-CM | POA: Insufficient documentation

## 2021-12-28 DIAGNOSIS — R0602 Shortness of breath: Secondary | ICD-10-CM | POA: Insufficient documentation

## 2021-12-28 LAB — BASIC METABOLIC PANEL
Anion gap: 15 (ref 5–15)
Anion gap: 8 (ref 5–15)
BUN: 8 mg/dL (ref 6–20)
BUN: 8 mg/dL (ref 6–20)
CO2: 19 mmol/L — ABNORMAL LOW (ref 22–32)
CO2: 26 mmol/L (ref 22–32)
Calcium: 9.6 mg/dL (ref 8.9–10.3)
Calcium: 9.5 mg/dL (ref 8.9–10.3)
Chloride: 106 mmol/L (ref 98–111)
Chloride: 105 mmol/L (ref 98–111)
Creatinine, Ser: 0.76 mg/dL (ref 0.44–1.00)
Creatinine, Ser: 0.79 mg/dL (ref 0.44–1.00)
GFR, Estimated: >60 mL/min (ref >60)
GFR, Estimated: >60 mL/min (ref >60)
Glucose, Bld: 90 mg/dL (ref 70–99)
Glucose, Bld: 88 mg/dL (ref 70–99)
Potassium: 5.1 mmol/L (ref 3.5–5.1)
Potassium: 3.9 mmol/L (ref 3.5–5.1)
Sodium: 140 mmol/L (ref 135–145)
Sodium: 139 mmol/L (ref 135–145)

## 2021-12-28 LAB — TROPONIN I (HIGH SENSITIVITY)
Troponin I (High Sensitivity): <2 ng/L (ref <18)
Troponin I (High Sensitivity): <2 ng/L (ref <18)

## 2021-12-28 LAB — D-DIMER, QUANTITATIVE: D-Dimer, Quant: 1.07 ug/mL-FEU — ABNORMAL HIGH (ref 0.00–0.50)

## 2021-12-28 LAB — URINALYSIS, ROUTINE W REFLEX MICROSCOPIC
Bilirubin Urine: NEGATIVE
Glucose, UA: NEGATIVE mg/dL
Hgb urine dipstick: NEGATIVE
Ketones, ur: NEGATIVE mg/dL
Leukocytes,Ua: NEGATIVE
Nitrite: NEGATIVE
Protein, ur: NEGATIVE mg/dL
Specific Gravity, Urine: 1.012 (ref 1.005–1.030)
pH: 6.5 (ref 5.0–8.0)

## 2021-12-28 LAB — CBC
HCT: 36 % (ref 36.0–46.0)
Hemoglobin: 12.1 g/dL (ref 12.0–15.0)
MCH: 28.9 pg (ref 26.0–34.0)
MCHC: 33.6 g/dL (ref 30.0–36.0)
MCV: 86.1 fL (ref 80.0–100.0)
Platelets: 348 10*3/uL (ref 150–400)
RBC: 4.18 MIL/uL (ref 3.87–5.11)
RDW: 12.1 % (ref 11.5–15.5)
WBC: 4.5 10*3/uL (ref 4.0–10.5)
nRBC: 0 % (ref 0.0–0.2)

## 2021-12-28 LAB — PREGNANCY, URINE: Preg Test, Ur: NEGATIVE

## 2021-12-28 LAB — TSH: TSH: 0.153 u[IU]/mL — ABNORMAL LOW (ref 0.350–4.500)

## 2021-12-28 MED ORDER — IOHEXOL 350 MG/ML SOLN
100.0000 mL | Freq: Once | INTRAVENOUS | Status: AC | PRN
  Administered 2021-12-28: 75 mL via INTRAVENOUS

## 2021-12-28 NOTE — Discharge Instructions (Addendum)
Is follow-up with your primary care doctor, return to the ER if you start having worsening chest pain, shortness of breath, nausea, vomiting.

## 2021-12-28 NOTE — ED Notes (Signed)
Patient ambulatory to CT

## 2021-12-28 NOTE — ED Provider Notes (Signed)
[facility] EMERGENCY DEPT Provider Note   CSN: [id number] Arrival date & time: 12/28/21  1058     History  Chief Complaint  Patient presents with   Chest Pain    Tanya Chapman is a 31 y.o. female, history of Graves' disease, who presents to the ED secondary to shortness of breath, chest discomfort that has been intermittent for the last 2 weeks.  Denies any recent surgery, but does state that she recently miscarried about a month ago.  Notes that she does take thyroid medication as she had her thyroid ablated, but has not had it checked in a while.  Notes that the chest pain is worse when she is laying down, alleviated by sitting up, denies any viral illnesses recently.  No fever, chills, abdominal pain.  Notes that when she is laying down it feels like she cannot breathe.  Also notes that she has a history of fluid on her lungs.     Home Medications Prior to Admission medications   Medication Sig Start Date End Date Taking? Authorizing Provider  dicyclomine (BENTYL) 20 MG tablet Take 1 tablet (20 mg total) by mouth 2 (two) times daily. 10/19/21   Henderly, Britni A, PA-C  Ferrous Fumarate (IRON) 18 MG TBCR Take 72 mg by mouth daily.    [provider]  labetalol (NORMODYNE) 100 MG tablet Take 1 tablet (100 mg total) by mouth 2 (two) times daily. 11/29/21 12/29/21  Judeth Horn, NP  levothyroxine (SYNTHROID) 200 MCG tablet Take 1 tablet (200 mcg total) by mouth daily before breakfast. 06/08/21   Reather Littler, MD      Allergies    Depo-provera [medroxyprogesterone acetate] and Doxycycline    Review of Systems   Review of Systems  Respiratory:  Positive for shortness of breath.   Cardiovascular:  Positive for chest pain.  Gastrointestinal:  Negative for abdominal pain, nausea and vomiting.    Physical Exam Updated Vital Signs BP (!) 155/93   Pulse 68   Temp 98.3 F (36.8 C) (Oral)   Resp 20   Ht 5\' 4"  (1.626 m)   Wt 97.1 kg   LMP 10/13/2021  (Exact Date)   SpO2 100%   Breastfeeding Unknown   BMI 36.73 kg/m  Physical Exam Vitals and nursing note reviewed.  Constitutional:      General: She is not in acute distress.    Appearance: She is well-developed.  HENT:     Head: Normocephalic and atraumatic.     Mouth/Throat:     Mouth: Mucous membranes are moist.     Pharynx: No pharyngeal swelling or posterior oropharyngeal erythema.     Tonsils: No tonsillar exudate. 1+ on the right. 1+ on the left.  Eyes:     Conjunctiva/sclera: Conjunctivae normal.  Cardiovascular:     Rate and Rhythm: Normal rate and regular rhythm.     Heart sounds: No murmur heard. Pulmonary:     Effort: Pulmonary effort is normal. No respiratory distress.     Breath sounds: Normal breath sounds.  Abdominal:     Palpations: Abdomen is soft.     Tenderness: There is no abdominal tenderness.  Musculoskeletal:        General: No swelling. Normal range of motion.     Cervical back: Neck supple.     Right lower leg: No edema.     Left lower leg: No edema.  Skin:    General: Skin is warm and dry.     Capillary Refill: Capillary  refill takes less than 2 seconds.  Neurological:     Mental Status: She is alert.  Psychiatric:        Mood and Affect: Mood normal.     ED Results / Procedures / Treatments   Labs (all labs ordered are listed, but only abnormal results are displayed) Labs Reviewed  BASIC METABOLIC PANEL - Abnormal; Notable for the following components:      Result Value   CO2 19 (*)    All other components within normal limits  D-DIMER, QUANTITATIVE - Abnormal; Notable for the following components:   D-Dimer, Quant 1.07 (*)    All other components within normal limits  TSH - Abnormal; Notable for the following components:   TSH 0.153 (*)    All other components within normal limits  CBC  PREGNANCY, URINE  URINALYSIS, ROUTINE W REFLEX MICROSCOPIC  BASIC METABOLIC PANEL  TROPONIN I (HIGH SENSITIVITY)  TROPONIN I (HIGH  SENSITIVITY)    EKG EKG Interpretation  Date/Time:  Monday December 28 2021 11:06:13 EST Ventricular Rate:  93 PR Interval:  176 QRS Duration: 84 QT Interval:  350 QTC Calculation: 435 R Axis:   87 Text Interpretation: Normal sinus rhythm Possible Anterior infarct , age undetermined Abnormal ECG When compared with ECG of 19-Oct-2021 20:13, TW inversion lead III new Confirmed by Alvira Monday ([id number]) on 12/28/2021 11:20:09 AM  Radiology CT Angio Chest PE W and/or Wo Contrast  Result Date: 12/28/2021 CLINICAL DATA:  Chest pain EXAM: CT ANGIOGRAPHY CHEST WITH CONTRAST TECHNIQUE: Multidetector CT imaging of the chest was performed using the standard protocol during bolus administration of intravenous contrast. Multiplanar CT image reconstructions and MIPs were obtained to evaluate the vascular anatomy. RADIATION DOSE REDUCTION: This exam was performed according to the departmental dose-optimization program which includes automated exposure control, adjustment of the mA and/or kV according to patient size and/or use of iterative reconstruction technique. CONTRAST:  73mL OMNIPAQUE IOHEXOL 350 MG/ML SOLN COMPARISON:  None Available. FINDINGS: Cardiovascular: No evidence of central pulmonary embolus, evaluation of the segmental and subsegmental pulmonary arteries is limited due to bolus timing. Normal heart size. No pericardial effusion. Normal caliber thoracic aorta with no atherosclerotic disease. No visible coronary artery calcifications. Mediastinum/Nodes: Esophagus unremarkable. Thyroid is unremarkable. Enlarged right upper paratracheal lymph node measuring 1.8 cm in short axis on series 5, image 61 Lungs/Pleura: Central airways are patent. No consolidation, pleural effusion or pneumothorax. Upper Abdomen: No acute abnormality. Musculoskeletal: No chest wall abnormality. No acute or significant osseous findings. Review of the MIP images confirms the above findings. IMPRESSION: 1. No evidence of  central pulmonary embolus, evaluation of the segmental and subsegmental pulmonary arteries is limited due to bolus timing. 2. No acute airspace opacity. 3. Enlarged right paratracheal lymph node measuring 1.8 cm in short axis, possibly reactive but given size, recommend follow-up chest CT in 3 months to ensure resolution. Electronically Signed   By: Allegra Lai M.D.   On: 12/28/2021 14:26   DG Chest 2 View  Result Date: 12/28/2021 CLINICAL DATA:  Chest pain EXAM: CHEST - 2 VIEW COMPARISON:  Radiograph 11/26/2010 FINDINGS: Unchanged cardiomediastinal silhouette. There is no focal airspace consolidation. No pleural effusion or pneumothorax. No acute osseous abnormality. Surgical clips overlie the lower neck. IMPRESSION: No evidence of acute cardiopulmonary disease. Electronically Signed   By: Caprice Renshaw M.D.   On: 12/28/2021 11:38    Procedures Procedures   Medications Ordered in ED Medications  iohexol (OMNIPAQUE) 350 MG/ML injection 100 mL (75 mLs Intravenous  Contrast Given 12/28/21 1403)    ED Course/ Medical Decision Making/ A&P                           Medical Decision Making Patient is a 31 year old female, here for chest pain, shortness of breath going on for the last 2 weeks.  She states that has been intermittent, and worse when she lays down.  She feels like when she lays down she cannot breathe.  She is well-appearing, has no muffled heart sounds, lab work obtained by triage RN.  D-dimer elevated on lab work, will obtain CTA given symptoms.  She has no muffled voice difficulty opening her mouth, or any kind of occlusion noted in her mouth.  Tonsils are 1+.  Amount and/or Complexity of Data Reviewed Labs: ordered.    Details: Elevated D-dimer, normal troponins. Radiology: ordered.    Details: CTA is negative, except for enlarged paratracheal lymph node, discussed with patient, she will need to follow-up with CT in 3 months. ECG/medicine tests:  Decision-making details  documented in ED Course. Discussion of management or test interpretation with external provider(s): Heart score of 1.  Does not sound ischemic in nature, and CTA ruled out PE.  Discussed need for follow-up given her enlarged lymph node with patient, she voiced understanding follow-up with her PCP.  Is difficult to determine the etiology of this, she declined any kind of GI cocktail.  She was advised to follow-up with her primary care doctor, and she voiced understanding.  Return precautions emphasized.  Risk Prescription drug management.   Final Clinical Impression(s) / ED Diagnoses Final diagnoses:  Chest pain, unspecified type    Rx / DC Orders ED Discharge Orders     None         Mersades Barbaro, Harley [place], PA 12/28/21 1550    Alvira Monday, MD 12/29/21 [phone]

## 2021-12-28 NOTE — ED Notes (Signed)
Patient verbalizes understanding of discharge instructions. Opportunity for questioning and answers were provided. Patient discharged from ED.  °

## 2021-12-28 NOTE — ED Triage Notes (Signed)
Pt arrives to ED with c/o chest pain and throat tightness x2 weeks. This morning she notes that it is more difficult to breath. The CP is substernal and is intermittent. Had miscarriage x1 month ago.

## 2022-04-08 ENCOUNTER — Encounter: Payer: Self-pay | Admitting: Gastroenterology

## 2022-04-27 ENCOUNTER — Ambulatory Visit (INDEPENDENT_AMBULATORY_CARE_PROVIDER_SITE_OTHER): Payer: 59 | Admitting: Gastroenterology

## 2022-04-27 ENCOUNTER — Encounter: Payer: Self-pay | Admitting: Gastroenterology

## 2022-04-27 ENCOUNTER — Other Ambulatory Visit (INDEPENDENT_AMBULATORY_CARE_PROVIDER_SITE_OTHER): Payer: 59

## 2022-04-27 VITALS — BP 122/70 | HR 76 | Ht 65.0 in | Wt 217.0 lb

## 2022-04-27 DIAGNOSIS — R1319 Other dysphagia: Secondary | ICD-10-CM | POA: Diagnosis not present

## 2022-04-27 DIAGNOSIS — R140 Abdominal distension (gaseous): Secondary | ICD-10-CM

## 2022-04-27 DIAGNOSIS — R1032 Left lower quadrant pain: Secondary | ICD-10-CM | POA: Diagnosis not present

## 2022-04-27 DIAGNOSIS — K5909 Other constipation: Secondary | ICD-10-CM | POA: Diagnosis not present

## 2022-04-27 DIAGNOSIS — K5900 Constipation, unspecified: Secondary | ICD-10-CM

## 2022-04-27 DIAGNOSIS — K219 Gastro-esophageal reflux disease without esophagitis: Secondary | ICD-10-CM

## 2022-04-27 LAB — COMPREHENSIVE METABOLIC PANEL WITH GFR
ALT: 7 U/L (ref 0–35)
AST: 10 U/L (ref 0–37)
Albumin: 4.1 g/dL (ref 3.5–5.2)
Alkaline Phosphatase: 76 U/L (ref 39–117)
BUN: 10 mg/dL (ref 6–23)
CO2: 24 meq/L (ref 19–32)
Calcium: 9.4 mg/dL (ref 8.4–10.5)
Chloride: 106 meq/L (ref 96–112)
Creatinine, Ser: 0.7 mg/dL (ref 0.40–1.20)
GFR: 114.61 mL/min
Glucose, Bld: 95 mg/dL (ref 70–99)
Potassium: 3.7 meq/L (ref 3.5–5.1)
Sodium: 140 meq/L (ref 135–145)
Total Bilirubin: 0.5 mg/dL (ref 0.2–1.2)
Total Protein: 7.6 g/dL (ref 6.0–8.3)

## 2022-04-27 LAB — CBC
HCT: 39.3 % (ref 36.0–46.0)
Hemoglobin: 13.2 g/dL (ref 12.0–15.0)
MCHC: 33.5 g/dL (ref 30.0–36.0)
MCV: 80.5 fl (ref 78.0–100.0)
Platelets: 353 K/uL (ref 150.0–400.0)
RBC: 4.88 Mil/uL (ref 3.87–5.11)
RDW: 14.1 % (ref 11.5–15.5)
WBC: 5.4 K/uL (ref 4.0–10.5)

## 2022-04-27 LAB — TSH: TSH: 0.04 u[IU]/mL — ABNORMAL LOW (ref 0.35–5.50)

## 2022-04-27 LAB — SEDIMENTATION RATE: Sed Rate: 13 mm/hr (ref 0–20)

## 2022-04-27 LAB — C-REACTIVE PROTEIN: CRP: <1 mg/dL (ref 0.5–20.0)

## 2022-04-27 LAB — LIPASE: Lipase: 12 U/L (ref 11.0–59.0)

## 2022-04-27 MED ORDER — ESOMEPRAZOLE MAGNESIUM 40 MG PO CPDR: 40.0000 mg | DELAYED_RELEASE_CAPSULE | Freq: Every day | ORAL | 6 refills | Status: DC

## 2022-04-27 MED ORDER — NA SULFATE-K SULFATE-MG SULF 17.5-3.13-1.6 GM/177ML PO SOLN: 1.0000 | ORAL | 0 refills | Status: DC

## 2022-04-27 NOTE — Progress Notes (Signed)
[place] VISIT   Primary Care Provider Patient, No Pcp Per No address on file None  Referring Provider Jenny Reichmann, PA-C [place],  [place] [address] [phone]  Patient Profile: Tanya Chapman is a 32 y.o. female with a pmh significant for hypertension, thyroid disease, anxiety, MDD, migraines, history of umbilical hernia repair, chronic constipation.  The patient presents to the [facility] for an evaluation and management of problem(s) noted below:  Problem List 1. Gastroesophageal reflux disease, unspecified whether esophagitis present   2. Esophageal dysphagia   3. LLQ pain   4. Chronic constipation   5. Bloating     History of Present Illness This is the patient's first visit to the outpatient [facility] clinic.  The patient states that she has been experiencing pain in her left lower quadrant beginning last summer.  At times this discomfort which is sharp in nature can last for up to 3 days at a time.  She has used Tylenol as well as NSAIDs without much effectiveness.  Using a warm blanket or warm heating pad helps at times but does not have complete alleviation until time passes.  She has dealt with constipation for years.  As she gets more and more constipated with up to 3 days going be between bowel movements she feels more bloating and abdominal distention.  She has used Colace as well as a colon detox without much effect.  She has used MiraLAX on an as-needed basis but never scheduled.  Even when she finally has a bowel movement, it is hard and she feels a sensation of incomplete evacuation.  The patient has dealt with acid reflux and pyrosis symptoms for years.  She will take Tums but has never used a PPI.  1-2 times per week she will have some difficulty swallowing solid foods.  She has not had to regurgitate her food but drinks more fluid to help it pass.  She has not had any weight loss  unintentionally.  She has never had an upper or lower endoscopy.  GI Review of Systems Positive as above Negative for odynophagia, nausea, vomiting, melena, hematochezia  Review of Systems General: Denies fevers/chills Cardiovascular: Denies chest pain Pulmonary: Denies shortness of breath Gastroenterological: See HPI Genitourinary: Denies darkened urine Hematological: Denies easy bruising/bleeding Endocrine: Positive for episodes/issues of temperature intolerance Dermatological: Denies jaundice Psychological: Mood is stable Musculoskeletal: Denies new arthralgias   Medications Current Outpatient Medications  Medication Sig Dispense Refill   esomeprazole (NEXIUM) 40 MG capsule Take 1 capsule (40 mg total) by mouth daily. 30 capsule 6   Ferrous Fumarate (IRON) 18 MG TBCR Take 72 mg by mouth daily.     levothyroxine (SYNTHROID) 200 MCG tablet Take 1 tablet (200 mcg total) by mouth daily before breakfast. 30 tablet 0   Na Sulfate-K Sulfate-Mg Sulf (SUPREP BOWEL PREP KIT) 17.5-3.13-1.6 GM/177ML SOLN Take 1 kit by mouth as directed. For colonoscopy prep 354 mL 0   labetalol (NORMODYNE) 100 MG tablet Take 1 tablet (100 mg total) by mouth 2 (two) times daily. 60 tablet 0   No current facility-administered medications for this visit.    Allergies Allergies  Allergen Reactions   Depo-Provera [Medroxyprogesterone Acetate]     Weight gain    Doxycycline Rash    Histories Past Medical History:  Diagnosis Date   Anxiety    Depression    Fatigue    Generalized headaches    migraines and generalized  Hyperthyroidism    MRSA carrier    Thyroid disease    hyperthyroid    Trouble swallowing    Visual disturbance    eyes bulging, blurry, and hurting    Weight loss, unintentional    Past Surgical History:  Procedure Laterality Date   CESAREAN SECTION  11/22/08   HERNIA REPAIR  1996   nexplanon  01/23/2015   Nexplanon removed   TOTAL THYROIDECTOMY  99991111   UMBILICAL  HERNIA REPAIR N/A 07/03/2021   Procedure: LAPAROSCOPIC SUPRAUMBILICAL HERNIA REPAIR;  Surgeon: Johnathan Hausen, MD;  Location: [facility];  Service: General;  Laterality: N/A;   Social History   Socioeconomic History   Marital status: Single    Spouse name: Not on file   Number of children: Not on file   Years of education: Not on file   Highest education level: Not on file  Occupational History   Not on file  Tobacco Use   Smoking status: Never   Smokeless tobacco: Never  Vaping Use   Vaping Use: Never used  Substance and Sexual Activity   Alcohol use: Not Currently    Comment: occ   Drug use: No   Sexual activity: Yes    Comment: last intercourse 10/17  Other Topics Concern   Not on file  Social History Narrative   Not on file   Social Determinants of Health   Financial Resource Strain: Not on file  Food Insecurity: Not on file  Transportation Needs: Not on file  Physical Activity: Not on file  Stress: Not on file  Social Connections: Not on file  Intimate Partner Violence: Not on file   Family History  Problem Relation Age of Onset   Migraines Mother    Migraines Brother    Diabetes Maternal Grandmother    Hypertension Maternal Grandmother    Hypertension Maternal Grandfather    Stroke Maternal Grandfather    Breast cancer Paternal Grandmother    Diabetes Paternal Grandmother    Thyroid disease Paternal Grandmother    Hypertension Paternal Grandfather    Colon cancer Neg Hx    Esophageal cancer Neg Hx    Inflammatory bowel disease Neg Hx    Liver disease Neg Hx    Pancreatic cancer Neg Hx    Rectal cancer Neg Hx    Stomach cancer Neg Hx    I have reviewed her medical, social, and family history in detail and updated the electronic medical record as necessary.    PHYSICAL EXAMINATION  BP 122/70   Pulse 76   Ht 5\' 5"  (1.651 m)   Wt 217 lb (98.4 kg)   LMP 10/13/2021 (Within Days)   BMI 36.11 kg/m  Wt Readings from Last 3 Encounters:  04/27/22 217 lb  (98.4 kg)  12/28/21 214 lb (97.1 kg)  11/29/21 214 lb (97.1 kg)  GEN: NAD, appears stated age, doesn't appear chronically ill PSYCH: Cooperative, without pressured speech EYE: Conjunctivae pink, sclerae anicteric ENT: MMM CV: RR without R/Gs  RESP: CTAB posteriorly, without wheezing GI: NABS, soft, protuberant abdomen, rounded, NT, without rebound or guarding MSK/EXT: No significant lower extremity edema SKIN: No jaundice NEURO:  Alert & Oriented x 3, no focal deficits   REVIEW OF DATA  I reviewed the following data at the time of this encounter:  GI Procedures and Studies  No relevant studies to review  Laboratory Studies  Reviewed those in epic  Imaging Studies  September 2023 CTAP IMPRESSION: 1. A specific cause for the patient's left  upper quadrant pain is not identified on today's imaging exam. 2. Mild cardiomegaly with trace bilateral pleural effusions and a trace anterior pericardial effusion. 3. Umbilical ventral hernia mesh noted with overlying subcutaneous scarring.  November 2023 CTChest IMPRESSION: 1. No evidence of central pulmonary embolus, evaluation of the segmental and subsegmental pulmonary arteries is limited due to bolus timing. 2. No acute airspace opacity. 3. Enlarged right paratracheal lymph node measuring 1.8 cm in short axis, possibly reactive but given size, recommend follow-up chest CT in 3 months to ensure resolution.   ASSESSMENT  Ms. Mannering is a 32 y.o. female with a pmh significant for hypertension, thyroid disease, anxiety, MDD, migraines, history of umbilical hernia repair, chronic constipation.  The patient is seen today for evaluation and management of:  1. Gastroesophageal reflux disease, unspecified whether esophagitis present   2. Esophageal dysphagia   3. LLQ pain   4. Chronic constipation   5. Bloating    The patient is hemodynamically stable at this time.  The patient's longer-lasting left lower quadrant abdominal  discomfort over the course of the last year is not completely defined even with imaging.  I suspect there is some component of IBS/C that is probably playing some role as she does have some effectiveness at times after she has a bowel movement but not more than 50% of the time.  We are going to try to make her bowels a little bit more regular and see if that may help the patient have less symptoms.  In regards to the patient's acid reflux symptoms were going to start her on a PPI and see how she does.  Because of the progressive nature of symptoms, I think further evaluation endoscopically makes sense but we will give her a little bit of time with medication to see where things go.  Will draw some laboratories as well.  Consideration of SIBO and EPI evaluation pending further workup.  The risks and benefits of endoscopic evaluation were discussed with the patient; these include but are not limited to the risk of perforation, infection, bleeding, missed lesions, lack of diagnosis, severe illness requiring hospitalization, as well as anesthesia and sedation related illnesses.  The patient and/or family is agreeable to proceed.  All patient questions were answered to the best of my ability, and the patient agrees to the aforementioned plan of action with follow-up as indicated.   PLAN  Laboratories as outlined below Initiate MiraLAX once daily for 1 week and then may increase to twice daily as needed Initiate FiberCon daily If after 2 to 3 weeks she has not noted an improvement in her bowels, trial of the Linzess samples daily to try to help move her bowels further Explored the low FODMAP chart further Initiate Nexium 40 mg daily Diagnostic endoscopy with possible dilation to be scheduled Diagnostic colonoscopy to be scheduled   Orders Placed This Encounter  Procedures   CBC   Comp Met (CMET)   Lipase   TSH   Sedimentation rate   C-reactive protein   IgA   Tissue transglutaminase, IgA    Ambulatory referral to Gastroenterology    New Prescriptions   ESOMEPRAZOLE (NEXIUM) 40 MG CAPSULE    Take 1 capsule (40 mg total) by mouth daily.   NA SULFATE-K SULFATE-MG SULF (SUPREP BOWEL PREP KIT) 17.5-3.13-1.6 GM/177ML SOLN    Take 1 kit by mouth as directed. For colonoscopy prep   Modified Medications   No medications on file    Planned Follow Up No follow-ups  on file.   Total Time in Face-to-Face and in Coordination of Care for patient including independent/personal interpretation/review of prior testing, medical history, examination, medication adjustment, communicating results with the patient directly, and documentation within the EHR is 45 minutes.   Justice Britain, MD [facility] Office # [phone]

## 2022-04-27 NOTE — Patient Instructions (Signed)
We have sent the following medications to your pharmacy for you to pick up at your convenience: Nexium , Suprep   A high fiber diet with plenty of fluids (up to 8 glasses of water daily) is suggested to relieve these symptoms. Fiber Con  1 tablespoon once daily can be used to keep bowels regular if needed.  Bowel movement regimen:  Miralax 1 capful 1-2 times daily If no bowel movement after 2 to 3 weeks then proceed with Linzess 145 mcg daily  Please see fod- map diet handout    You have been scheduled for an endoscopy and colonoscopy. Please follow the written instructions given to you at your visit today. Please pick up your prep supplies at the pharmacy within the next 1-3 days. If you use inhalers (even only as needed), please bring them with you on the day of your procedure.  Your provider has requested that you go to the basement level for lab work before leaving today. Press "B" on the elevator. The lab is located at the first door on the left as you exit the elevator.  _______________________________________________________  If your blood pressure at your visit was 140/90 or greater, please contact your primary care physician to follow up on this.  _______________________________________________________  If you are age 7 or older, your body mass index should be between 23-30. Your Body mass index is 36.11 kg/m. If this is out of the aforementioned range listed, please consider follow up with your Primary Care Provider.  If you are age 59 or younger, your body mass index should be between 19-25. Your Body mass index is 36.11 kg/m. If this is out of the aformentioned range listed, please consider follow up with your Primary Care Provider.   ________________________________________________________  The [facility] providers would like to encourage you to use [facility] to communicate with providers for non-urgent requests or questions.  Due to long hold times on the telephone,  sending your provider a message by [facility] may be a faster and more efficient way to get a response.  Please allow 48 business hours for a response.  Please remember that this is for non-urgent requests.  _______________________________________________________  Due to recent changes in healthcare laws, you may see the results of your imaging and laboratory studies on MyChart before your provider has had a chance to review them.  We understand that in some cases there may be results that are confusing or concerning to you. Not all laboratory results come back in the same time frame and the provider may be waiting for multiple results in order to interpret others.  Please give [place] 48 hours in order for your provider to thoroughly review all the results before contacting the office for clarification of your results.   Thank you for choosing me and [facility].  Dr. Rush Landmark

## 2022-04-28 ENCOUNTER — Encounter: Payer: Self-pay | Admitting: Gastroenterology

## 2022-04-28 DIAGNOSIS — R1032 Left lower quadrant pain: Secondary | ICD-10-CM | POA: Insufficient documentation

## 2022-04-28 DIAGNOSIS — K5909 Other constipation: Secondary | ICD-10-CM | POA: Insufficient documentation

## 2022-04-28 DIAGNOSIS — R140 Abdominal distension (gaseous): Secondary | ICD-10-CM | POA: Insufficient documentation

## 2022-04-28 DIAGNOSIS — K219 Gastro-esophageal reflux disease without esophagitis: Secondary | ICD-10-CM | POA: Insufficient documentation

## 2022-04-28 DIAGNOSIS — R1319 Other dysphagia: Secondary | ICD-10-CM | POA: Insufficient documentation

## 2022-04-28 LAB — IGA: Immunoglobulin A: 290 mg/dL (ref 47–310)

## 2022-04-28 LAB — TISSUE TRANSGLUTAMINASE, IGA: (tTG) Ab, IgA: <1 U/mL

## 2022-06-17 ENCOUNTER — Encounter: Payer: Self-pay | Admitting: Certified Registered Nurse Anesthetist

## 2022-06-18 ENCOUNTER — Telehealth: Payer: Self-pay | Admitting: *Deleted

## 2022-06-18 NOTE — Telephone Encounter (Signed)
CRNA was reviewing chart for procedure next week. Noticed a pregnancy sticky note on pt's chart. Reviewed chart and it appeared that pt had miscarried in November 2023. Called pt to verify pregnancy status. Pt confirmed she did miscarry and is not currently pregnant. Instructed pt that since she was not on any medication to prevent pregnancy and if it has been more than 2 weeks since her LMP she will need to complete a pregnancy test or sign a waiver on the procedure day. Pt verbalized understanding. Plan to proceed with procedure next week.

## 2022-06-23 ENCOUNTER — Ambulatory Visit (AMBULATORY_SURGERY_CENTER): Payer: 59 | Admitting: Gastroenterology

## 2022-06-23 ENCOUNTER — Encounter: Payer: Self-pay | Admitting: Gastroenterology

## 2022-06-23 VITALS — BP 133/84 | HR 53 | Temp 98.6°F | Resp 12 | Ht 65.0 in | Wt 217.0 lb

## 2022-06-23 DIAGNOSIS — K219 Gastro-esophageal reflux disease without esophagitis: Secondary | ICD-10-CM | POA: Diagnosis not present

## 2022-06-23 DIAGNOSIS — K2970 Gastritis, unspecified, without bleeding: Secondary | ICD-10-CM | POA: Diagnosis not present

## 2022-06-23 DIAGNOSIS — K635 Polyp of colon: Secondary | ICD-10-CM | POA: Diagnosis not present

## 2022-06-23 DIAGNOSIS — D127 Benign neoplasm of rectosigmoid junction: Secondary | ICD-10-CM | POA: Diagnosis not present

## 2022-06-23 DIAGNOSIS — K2950 Unspecified chronic gastritis without bleeding: Secondary | ICD-10-CM | POA: Diagnosis not present

## 2022-06-23 DIAGNOSIS — K5909 Other constipation: Secondary | ICD-10-CM

## 2022-06-23 DIAGNOSIS — R1310 Dysphagia, unspecified: Secondary | ICD-10-CM

## 2022-06-23 DIAGNOSIS — R12 Heartburn: Secondary | ICD-10-CM | POA: Diagnosis not present

## 2022-06-23 DIAGNOSIS — F32A Depression, unspecified: Secondary | ICD-10-CM | POA: Diagnosis not present

## 2022-06-23 DIAGNOSIS — K229 Disease of esophagus, unspecified: Secondary | ICD-10-CM | POA: Diagnosis not present

## 2022-06-23 DIAGNOSIS — F419 Anxiety disorder, unspecified: Secondary | ICD-10-CM | POA: Diagnosis not present

## 2022-06-23 MED ORDER — SODIUM CHLORIDE 0.9 % IV SOLN: 500.0000 mL | INTRAVENOUS | Status: DC

## 2022-06-23 NOTE — Op Note (Signed)
[facility] Patient Name: Tanya Chapman Procedure Date: 06/23/2022 10:12 AM MRN: [id number] Endoscopist: Corliss Parish , MD, [id number] Age: 32 Referring MD:  Date of Birth: [birth year] Gender: Female Account #: [id number] Procedure:                Upper GI endoscopy Indications:              Lower abdominal pain, Dysphagia, Heartburn,                            Abdominal bloating Medicines:                Monitored Anesthesia Care Procedure:                Pre-Anesthesia Assessment:                           - Prior to the procedure, a History and Physical                            was performed, and patient medications and                            allergies were reviewed. The patient's tolerance of                            previous anesthesia was also reviewed. The risks                            and benefits of the procedure and the sedation                            options and risks were discussed with the patient.                            All questions were answered, and informed consent                            was obtained. Prior Anticoagulants: The patient has                            taken no anticoagulant or antiplatelet agents. ASA                            Grade Assessment: II - A patient with mild systemic                            disease. After reviewing the risks and benefits,                            the patient was deemed in satisfactory condition to                            undergo the procedure.  After obtaining informed consent, the endoscope was                            passed under direct vision. Throughout the                            procedure, the patient's blood pressure, pulse, and                            oxygen saturations were monitored continuously. The                            GIF [id number] #[id number] was introduced through the                            mouth, and advanced to the second  part of duodenum.                            The upper GI endoscopy was accomplished without                            difficulty. The patient tolerated the procedure. Scope In: Scope Out: Findings:                 No gross lesions were noted in the entire                            esophagus. Biopsies were taken with a cold forceps                            for histology to rule out EOE/LOE. After the rest                            of the EGD was completed, a guidewire was placed                            and the scope was withdrawn. Dilation was performed                            with a Savary dilator with no resistance at 18 mm.                            The dilation site was examined following endoscope                            reinsertion and showed no change.                           The Z-line was irregular and was found 40 cm from                            the incisors.  Patchy moderately erythematous mucosa without                            bleeding was found in the entire examined stomach.                            Biopsies were taken with a cold forceps for                            histology and Helicobacter pylori testing.                           No gross lesions were noted in the duodenal bulb,                            in the first portion of the duodenum and in the                            second portion of the duodenum. Complications:            No immediate complications. Estimated Blood Loss:     Estimated blood loss was minimal. Impression:               - No gross lesions in the entire esophagus.                            Biopsied. Dilated.                           - Z-line irregular, 40 cm from the incisors.                           - Erythematous mucosa in the stomach. Biopsied.                           - No gross lesions in the duodenal bulb, in the                            first portion of the duodenum and in the  second                            portion of the duodenum. Recommendation:           - Proceed to scheduled colonoscopy.                           - Continue present medications.                           - Await pathology results.                           - If dysphagia symptoms are persisting, will need                            to consider  esophageal manometry.                           - The findings and recommendations were discussed                            with the patient.                           - The findings and recommendations were discussed                            with the patient's family. Corliss Parish, MD 06/23/2022 10:46:11 AM

## 2022-06-23 NOTE — Patient Instructions (Addendum)
Recommendation:           - The patient will be observed post-procedure,                            until all discharge criteria are met.                           - Discharge patient to home.                           - Patient has a contact number available for                            emergencies. The signs and symptoms of potential                            delayed complications were discussed with the                            patient. Return to normal activities tomorrow.                            Written discharge instructions were provided to the                            patient.                           - High fiber diet.                           - Use FiberCon 1-2 tablets PO daily.                           - Continue present medications.                           - Await pathology results. If the patient has                            evidence of adenomatous tissue, would recommend a                            earlier follow-up colonoscopy likely in 3 years due                            to age, if patient has no evidence of adenomatous                            tissue, then repeat colonoscopy at age 78 based on                            current guidelines.                           -  Repeat colonoscopy for surveillance based on                            pathology results.                           - The findings and recommendations were discussed                            with the patient.                           - The findings and recommendations were discussed                            with the patient's family.  Handouts on high fiber diet, dilatation diet, polyps, gastritis and hemorrhoids given.  YOU HAD AN ENDOSCOPIC PROCEDURE TODAY AT THE  ENDOSCOPY CENTER:   Refer to the procedure report that was given to you for any specific questions about what was found during the examination.  If the procedure report does not answer your questions, please call  your gastroenterologist to clarify.  If you requested that your care partner not be given the details of your procedure findings, then the procedure report has been included in a sealed envelope for you to review at your convenience later.  YOU SHOULD EXPECT: Some feelings of bloating in the abdomen. Passage of more gas than usual.  Walking can help get rid of the air that was put into your GI tract during the procedure and reduce the bloating. If you had a lower endoscopy (such as a colonoscopy or flexible sigmoidoscopy) you may notice spotting of blood in your stool or on the toilet paper. If you underwent a bowel prep for your procedure, you may not have a normal bowel movement for a few days.  Please Note:  You might notice some irritation and congestion in your nose or some drainage.  This is from the oxygen used during your procedure.  There is no need for concern and it should clear up in a day or so.  SYMPTOMS TO REPORT IMMEDIATELY:  Following lower endoscopy (colonoscopy or flexible sigmoidoscopy):  Excessive amounts of blood in the stool  Significant tenderness or worsening of abdominal pains  Swelling of the abdomen that is new, acute  Fever of 100F or higher  Following upper endoscopy (EGD)  Vomiting of blood or coffee ground material  New chest pain or pain under the shoulder blades  Painful or persistently difficult swallowing  New shortness of breath  Fever of 100F or higher  Black, tarry-looking stools  For urgent or emergent issues, a gastroenterologist can be reached at any hour by calling [phone]. Do not use MyChart messaging for urgent concerns.    DIET:  We do recommend a small meal at first, but then you may proceed to your regular diet.  Drink plenty of fluids but you should avoid alcoholic beverages for 24 hours.  ACTIVITY:  You should plan to take it easy for the rest of today and you should NOT DRIVE or use heavy machinery until tomorrow (because of the  sedation medicines used during the test).    FOLLOW UP: Our staff will call the number listed on your records the next business day  following your procedure.  We will call around 7:15- 8:00 am to check on you and address any questions or concerns that you may have regarding the information given to you following your procedure. If we do not reach you, we will leave a message.     If any biopsies were taken you will be contacted by phone or by letter within the next 1-3 weeks.  Please call us at [phone] if you have not heard about the biopsies in 3 weeks.    SIGNATURES/CONFIDENTIALITY: You and/or your care partner have signed paperwork which will be entered into your electronic medical record.  These signatures attest to the fact that that the information above on your After Visit Summary has been reviewed and is understood.  Full responsibility of the confidentiality of this discharge information lies with you and/or your care-partner.

## 2022-06-23 NOTE — Progress Notes (Unsigned)
GASTROENTEROLOGY PROCEDURE H&P NOTE   Primary Care Physician: Patient, No Pcp Per  HPI: Tanya Chapman is a 32 y.o. female who presents for EGD/Colonoscopy for evaluation of bloating/GERD/Dysphagia/LLQ abdominal pain/chronic constipation.  Past Medical History:  Diagnosis Date   Anxiety    Depression    Fatigue    Generalized headaches    migraines and generalized    Hyperthyroidism    MRSA carrier    Thyroid disease    hyperthyroid    Trouble swallowing    Visual disturbance    eyes bulging, blurry, and hurting    Weight loss, unintentional    Past Surgical History:  Procedure Laterality Date   CESAREAN SECTION  11/22/08   HERNIA REPAIR  1996   nexplanon  01/23/2015   Nexplanon removed   TOTAL THYROIDECTOMY  11/26/10   UMBILICAL HERNIA REPAIR N/A 07/03/2021   Procedure: LAPAROSCOPIC SUPRAUMBILICAL HERNIA REPAIR;  Surgeon: Luretha Murphy, MD;  Location: [facility];  Service: General;  Laterality: N/A;   Current Outpatient Medications  Medication Sig Dispense Refill   esomeprazole (NEXIUM) 40 MG capsule Take 1 capsule (40 mg total) by mouth daily. 30 capsule 6   Ferrous Fumarate (IRON) 18 MG TBCR Take 72 mg by mouth daily.     labetalol (NORMODYNE) 100 MG tablet Take 1 tablet (100 mg total) by mouth 2 (two) times daily. 60 tablet 0   levothyroxine (SYNTHROID) 200 MCG tablet Take 1 tablet (200 mcg total) by mouth daily before breakfast. 30 tablet 0   Na Sulfate-K Sulfate-Mg Sulf (SUPREP BOWEL PREP KIT) 17.5-3.13-1.6 GM/177ML SOLN Take 1 kit by mouth as directed. For colonoscopy prep 354 mL 0   Current Facility-Administered Medications  Medication Dose Route Frequency Provider Last Rate Last Admin   0.9 %  sodium chloride infusion  500 mL Intravenous Continuous Mansouraty, Netty Starring., MD        Current Outpatient Medications:    esomeprazole (NEXIUM) 40 MG capsule, Take 1 capsule (40 mg total) by mouth daily., Disp: 30 capsule, Rfl: 6   Ferrous Fumarate (IRON) 18  MG TBCR, Take 72 mg by mouth daily., Disp: , Rfl:    labetalol (NORMODYNE) 100 MG tablet, Take 1 tablet (100 mg total) by mouth 2 (two) times daily., Disp: 60 tablet, Rfl: 0   levothyroxine (SYNTHROID) 200 MCG tablet, Take 1 tablet (200 mcg total) by mouth daily before breakfast., Disp: 30 tablet, Rfl: 0   Na Sulfate-K Sulfate-Mg Sulf (SUPREP BOWEL PREP KIT) 17.5-3.13-1.6 GM/177ML SOLN, Take 1 kit by mouth as directed. For colonoscopy prep, Disp: 354 mL, Rfl: 0  Current Facility-Administered Medications:    0.9 %  sodium chloride infusion, 500 mL, Intravenous, Continuous, Mansouraty, Netty Starring., MD Allergies  Allergen Reactions   Doxycycline Rash   Family History  Problem Relation Age of Onset   Migraines Mother    Migraines Brother    Diabetes Maternal Grandmother    Hypertension Maternal Grandmother    Hypertension Maternal Grandfather    Stroke Maternal Grandfather    Breast cancer Paternal Grandmother    Diabetes Paternal Grandmother    Thyroid disease Paternal Grandmother    Hypertension Paternal Grandfather    Colon cancer Neg Hx    Esophageal cancer Neg Hx    Inflammatory bowel disease Neg Hx    Liver disease Neg Hx    Pancreatic cancer Neg Hx    Rectal cancer Neg Hx    Stomach cancer Neg Hx    Social History   Socioeconomic  History   Marital status: Single    Spouse name: Not on file   Number of children: Not on file   Years of education: Not on file   Highest education level: Not on file  Occupational History   Not on file  Tobacco Use   Smoking status: Never   Smokeless tobacco: Never  Vaping Use   Vaping Use: Never used  Substance and Sexual Activity   Alcohol use: Not Currently    Comment: occ   Drug use: No   Sexual activity: Yes    Comment: last intercourse 10/17  Other Topics Concern   Not on file  Social History Narrative   Not on file   Social Determinants of Health   Financial Resource Strain: Not on file  Food Insecurity: Not on file   Transportation Needs: Not on file  Physical Activity: Not on file  Stress: Not on file  Social Connections: Not on file  Intimate Partner Violence: Not on file    Physical Exam: Today's Vitals   06/23/22 0922 06/23/22 0927  BP: 112/72   Pulse: 86   Temp: 98.6 F (37 C) 98.6 F (37 C)  SpO2: 100%   Weight: 217 lb (98.4 kg)   Height: 5\' 5"  (1.651 m)    Body mass index is 36.11 kg/m. GEN: NAD EYE: Sclerae anicteric ENT: MMM CV: Non-tachycardic GI: Soft, NT/ND NEURO:  Alert & Oriented x 3  Lab Results: No results for input(s): "WBC", "HGB", "HCT", "PLT" in the last 72 hours. BMET No results for input(s): "NA", "K", "CL", "CO2", "GLUCOSE", "BUN", "CREATININE", "CALCIUM" in the last 72 hours. LFT No results for input(s): "PROT", "ALBUMIN", "AST", "ALT", "ALKPHOS", "BILITOT", "BILIDIR", "IBILI" in the last 72 hours. PT/INR No results for input(s): "LABPROT", "INR" in the last 72 hours.   Impression / Plan: This is a 32 y.o.female who presents for EGD/Colonoscopy for evaluation of bloating/GERD/Dysphagia/LLQ abdominal pain/chronic constipation.  The risks and benefits of endoscopic evaluation/treatment were discussed with the patient and/or family; these include but are not limited to the risk of perforation, infection, bleeding, missed lesions, lack of diagnosis, severe illness requiring hospitalization, as well as anesthesia and sedation related illnesses.  The patient's history has been reviewed, patient examined, no change in status, and deemed stable for procedure.  The patient and/or family is agreeable to proceed.    Corliss Parish, MD [facility] Advanced [facility]oscopy Office # [phone]

## 2022-06-23 NOTE — Op Note (Signed)
[facility] Patient Name: Tanya Chapman Procedure Date: 06/23/2022 10:13 AM MRN: [id number] Endoscopist: Corliss Parish , MD, [id number] Age: 32 Referring MD:  Date of Birth: 1990/09/02 Gender: Female Account #: [id number] Procedure:                Colonoscopy Indications:              Abdominal pain in the left lower quadrant, Lower                            abdominal pain, Chronic idiopathic constipation Medicines:                Monitored Anesthesia Care Procedure:                Pre-Anesthesia Assessment:                           - Prior to the procedure, a History and Physical                            was performed, and patient medications and                            allergies were reviewed. The patient's tolerance of                            previous anesthesia was also reviewed. The risks                            and benefits of the procedure and the sedation                            options and risks were discussed with the patient.                            All questions were answered, and informed consent                            was obtained. Prior Anticoagulants: The patient has                            taken no anticoagulant or antiplatelet agents. ASA                            Grade Assessment: II - A patient with mild systemic                            disease. After reviewing the risks and benefits,                            the patient was deemed in satisfactory condition to                            undergo the procedure.  After obtaining informed consent, the colonoscope                            was passed under direct vision. Throughout the                            procedure, the patient's blood pressure, pulse, and                            oxygen saturations were monitored continuously. The                            Olympus CF-HQ190L SN [id number] was introduced through                             the anus and advanced to the 3 cm into the ileum.                            The colonoscopy was performed without difficulty.                            The patient tolerated the procedure. The quality of                            the bowel preparation was good. The terminal ileum,                            ileocecal valve, appendiceal orifice, and rectum                            were photographed. Scope In: 10:30:22 AM Scope Out: 10:40:58 AM Scope Withdrawal Time: 0 hours 8 minutes 10 seconds  Total Procedure Duration: 0 hours 10 minutes 36 seconds  Findings:                 The digital rectal exam was normal. Pertinent                            negatives include no palpable rectal lesions.                           The terminal ileum and ileocecal valve appeared                            normal.                           A 3 mm polyp was found in the recto-sigmoid colon.                            The polyp was sessile. The polyp was removed with a                            cold snare. Resection and retrieval were complete.  Normal mucosa was found in the entire colon                            otherwise.                           Non-bleeding non-thrombosed internal hemorrhoids                            were found during retroflexion, during perianal                            exam and during digital exam. The hemorrhoids were                            Grade I (internal hemorrhoids that do not prolapse). Complications:            No immediate complications. Estimated Blood Loss:     Estimated blood loss was minimal. Impression:               - The examined portion of the ileum was normal.                           - One 3 mm polyp at the recto-sigmoid colon,                            removed with a cold snare. Resected and retrieved.                           - Normal mucosa in the entire examined colon                            otherwise.                            - Non-bleeding non-thrombosed internal hemorrhoids. Recommendation:           - The patient will be observed post-procedure,                            until all discharge criteria are met.                           - Discharge patient to home.                           - Patient has a contact number available for                            emergencies. The signs and symptoms of potential                            delayed complications were discussed with the                            patient. Return to normal activities tomorrow.  Written discharge instructions were provided to the                            patient.                           - High fiber diet.                           - Use FiberCon 1-2 tablets PO daily.                           - Continue present medications.                           - Await pathology results. If the patient has                            evidence of adenomatous tissue, would recommend a                            earlier follow-up colonoscopy likely in 3 years due                            to age, if patient has no evidence of adenomatous                            tissue, then repeat colonoscopy at age 89 based on                            current guidelines.                           - Repeat colonoscopy for surveillance based on                            pathology results.                           - The findings and recommendations were discussed                            with the patient.                           - The findings and recommendations were discussed                            with the patient's family. Corliss Parish, MD 06/23/2022 10:49:30 AM

## 2022-06-23 NOTE — Progress Notes (Unsigned)
1013 Robinul 0.1 mg IV given due large amount of secretions upon assessment.  MD made aware, vss  ?

## 2022-06-23 NOTE — Progress Notes (Unsigned)
Report given to PACU, vss 

## 2022-06-23 NOTE — Progress Notes (Signed)
Called to room to assist during endoscopic procedure.  Patient ID and intended procedure confirmed with present staff. Received instructions for my participation in the procedure from the performing physician.  

## 2022-06-24 ENCOUNTER — Telehealth: Payer: Self-pay

## 2022-06-24 NOTE — Telephone Encounter (Signed)
  Follow up Call-     06/23/2022    9:27 AM  Call back number  Post procedure Call Back phone  # [phone]  Permission to leave phone message Yes     Patient questions:  Do you have a fever, pain , or abdominal swelling? No. Pain Score  0 *  Have you tolerated food without any problems? Yes.    Have you been able to return to your normal activities? Yes.    Do you have any questions about your discharge instructions: Diet   No. Medications  No. Follow up visit  No.  Do you have questions or concerns about your Care? No.  Actions: * If pain score is 4 or above: No action needed, pain <4.

## 2022-07-02 ENCOUNTER — Encounter: Payer: Self-pay | Admitting: Gastroenterology

## 2022-07-13 DIAGNOSIS — I10 Essential (primary) hypertension: Secondary | ICD-10-CM | POA: Diagnosis not present

## 2022-07-13 DIAGNOSIS — R002 Palpitations: Secondary | ICD-10-CM | POA: Diagnosis not present

## 2022-07-13 DIAGNOSIS — E039 Hypothyroidism, unspecified: Secondary | ICD-10-CM | POA: Diagnosis not present

## 2022-07-13 DIAGNOSIS — M79644 Pain in right finger(s): Secondary | ICD-10-CM | POA: Diagnosis not present

## 2022-07-13 DIAGNOSIS — E663 Overweight: Secondary | ICD-10-CM | POA: Diagnosis not present

## 2022-07-16 LAB — LAB REPORT - SCANNED
EGFR: 110
TSH: 0.26 — AB (ref 0.41–5.90)

## 2022-07-26 ENCOUNTER — Encounter: Payer: Self-pay | Admitting: Cardiology

## 2022-07-26 ENCOUNTER — Ambulatory Visit: Payer: 59 | Admitting: Cardiology

## 2022-07-26 VITALS — BP 134/91 | HR 79 | Ht 65.0 in | Wt 215.0 lb

## 2022-07-26 DIAGNOSIS — R002 Palpitations: Secondary | ICD-10-CM | POA: Diagnosis not present

## 2022-07-26 DIAGNOSIS — I517 Cardiomegaly: Secondary | ICD-10-CM | POA: Diagnosis not present

## 2022-07-26 DIAGNOSIS — I10 Essential (primary) hypertension: Secondary | ICD-10-CM

## 2022-07-26 NOTE — Progress Notes (Addendum)
ID:  Tanya Chapman, DOB [birth year], MRN [id number]  PCP:  Salvatore Decent, PA-C  Cardiologist:  Tessa Lerner, DO, [facility] (established care 07/26/22)  REASON FOR CONSULT: Palpitations  REQUESTING PHYSICIAN:  Arthur Holms, PA [address] [place],  [place] [address]  Chief Complaint  Patient presents with   Palpitations   Establish Care    HPI  Tanya Chapman is a 32 y.o. African-American female who presents to the clinic for evaluation of palpitations at the request of [place], [place], [place]. Her past medical history and cardiovascular risk factors include:History of hyperthyroidism status post total thyroidectomy and nowHypothyroidism, HTN, GERD, esophageal dysphagia, chronic constipation.   Patient is referred to the practice for evaluation of palpitations.  Palpitations: Ongoing for the last several years. Occurring weekly. No precipitating factors. Better with calming down/relaxation. Worse with stress. No prior history of near syncope or syncopal events. Has a history of hypothyroidism since the age of 64. Denies use of coffee. Intermittently consumes tea. No sodas, recreational drugs, weight loss supplements, stimulants. Socially drinks alcohol/energy drinks once or twice a month.  Review of systems also positive for enlarged heart.  Patient states that she has had imaging studies last year which noted an enlarged heart.  However, due to lack of insurance was unable to follow-up.  She denies anginal chest pain or heart failure symptoms.  She endorses heart disease amongst maternal grandparents unsure if it is premature.  No history of sudden cardiac death or cardiomyopathy.  Patient states that her PCP recently checked blood and lupus anticoagulant were positive.  She goes to see them tomorrow for further diagnostic workup and follow-through.  In addition, patient states that she has been pregnant 3 times and has experienced spontaneous abortions.  Patient was on  labetalol for antihypertension.  However since she was pregnant it was transitioned to metoprolol.  FUNCTIONAL STATUS: No structured exercise program or daily routine  CARDIAC DATABASE: EKG: July 26, 2022: Sinus rhythm, 67 bpm, normal axis, without underlying ischemia or injury pattern.  Echocardiogram: No results found for this or any previous visit from the past 1095 days.   Stress Testing: No results found for this or any previous visit from the past 1095 days.  Heart Catheterization: None   RADIOLOGY: CT Abdomen Pelvis w/ contrast 10/19/2021: 1. A specific cause for the patient's left upper quadrant pain is not identified on today's imaging exam. 2. Mild cardiomegaly with trace bilateral pleural effusions and a trace anterior pericardial effusion. 3. Umbilical ventral hernia mesh noted with overlying subcutaneous scarring.  CT PE protocol 12/28/2021: 1. No evidence of central pulmonary embolus, evaluation of the segmental and subsegmental pulmonary arteries is limited due to bolus timing. 2. No acute airspace opacity. 3. Enlarged right paratracheal lymph node measuring 1.8 cm in short axis, possibly reactive but given size, recommend follow-up chest CT in 3 months to ensure resolution.  ALLERGIES: Allergies  Allergen Reactions   Doxycycline Rash    MEDICATION LIST PRIOR TO VISIT: Current Meds  Medication Sig   Ferrous Fumarate (IRON) 18 MG TBCR Take 72 mg by mouth daily.   levothyroxine (SYNTHROID) 200 MCG tablet Take 1 tablet (200 mcg total) by mouth daily before breakfast.   metoprolol succinate (TOPROL-XL) 25 MG 24 hr tablet Take 25 mg by mouth daily.     PAST MEDICAL HISTORY: Past Medical History:  Diagnosis Date   Anxiety    Depression    Fatigue    Generalized headaches    migraines and generalized  Hypertension    Hyperthyroidism    MRSA carrier    Thyroid disease    hyperthyroid    Trouble swallowing    Visual disturbance    eyes bulging,  blurry, and hurting    Weight loss, unintentional     PAST SURGICAL HISTORY: Past Surgical History:  Procedure Laterality Date   CESAREAN SECTION  11/22/08   HERNIA REPAIR  1996   nexplanon  01/23/2015   Nexplanon removed   TOTAL THYROIDECTOMY  11/26/10   UMBILICAL HERNIA REPAIR N/A 07/03/2021   Procedure: LAPAROSCOPIC SUPRAUMBILICAL HERNIA REPAIR;  Surgeon: Luretha Murphy, MD;  Location: [facility];  Service: General;  Laterality: N/A;    FAMILY HISTORY: The patient family history includes Breast cancer in her paternal grandmother; Diabetes in her maternal grandmother and paternal grandmother; Hypertension in her maternal grandfather, maternal grandmother, and paternal grandfather; Migraines in her brother and mother; Stroke in her maternal grandfather; Thyroid disease in her paternal grandmother.  SOCIAL HISTORY:  The patient  reports that she has never smoked. She has never used smokeless tobacco. She reports that she does not currently use alcohol. She reports that she does not use drugs.  REVIEW OF SYSTEMS: Review of Systems  Cardiovascular:  Positive for palpitations. Negative for chest pain, claudication, dyspnea on exertion, irregular heartbeat, leg swelling, near-syncope, orthopnea, paroxysmal nocturnal dyspnea and syncope.  Respiratory:  Negative for shortness of breath.   Hematologic/Lymphatic: Negative for bleeding problem.  Musculoskeletal:  Negative for muscle cramps and myalgias.  Neurological:  Negative for dizziness and light-headedness.    PHYSICAL EXAM:    07/26/2022    8:44 AM 06/23/2022   11:04 AM 06/23/2022   10:54 AM  Vitals with BMI  Height 5\' 5"     Weight 215 lbs    BMI 35.78    Systolic 134 133 161  Diastolic 91 84 92  Pulse 79 53 56    Physical Exam  Constitutional: No distress.  Age appropriate, hemodynamically stable.   Neck: No JVD present.  Prior thyroidectomy scar well-healed.  Cardiovascular: Normal rate, regular rhythm, S1 normal, S2  normal, intact distal pulses and normal pulses. Exam reveals no gallop, no S3 and no S4.  No murmur heard. Pulmonary/Chest: Effort normal and breath sounds normal. No stridor. She has no wheezes. She has no rales.  Abdominal: Soft. Bowel sounds are normal. She exhibits no distension. There is no abdominal tenderness.  Musculoskeletal:        General: No edema.     Cervical back: Neck supple.  Neurological: She is alert and oriented to person, place, and time. She has intact cranial nerves (2-12).  Skin: Skin is warm and moist.  Tattoos present     LABORATORY DATA:    Latest Ref Rng & Units 04/27/2022   11:34 AM 12/28/2021   11:25 AM 11/29/2021    6:17 PM  CBC  WBC 4.0 - 10.5 K/uL 5.4  4.5  6.3   Hemoglobin 12.0 - 15.0 g/dL 09.6  04.5  40.9   Hematocrit 36.0 - 46.0 % 39.3  36.0  33.6   Platelets 150.0 - 400.0 K/uL 353.0  348  324        Latest Ref Rng & Units 04/27/2022   11:34 AM 12/28/2021   12:00 PM 12/28/2021   11:25 AM  CMP  Glucose 70 - 99 mg/dL 95  88  90   BUN 6 - 23 mg/dL 10  8  8    Creatinine 0.40 - 1.20 mg/dL 8.11  0.79  0.76   Sodium 135 - 145 mEq/L 140  139  140   Potassium 3.5 - 5.1 mEq/L 3.7  3.9  5.1   Chloride 96 - 112 mEq/L 106  105  106   CO2 19 - 32 mEq/L 24  26  19    Calcium 8.4 - 10.5 mg/dL 9.4  9.5  9.6   Total Protein 6.0 - 8.3 g/dL 7.6     Total Bilirubin 0.2 - 1.2 mg/dL 0.5     Alkaline Phos 39 - 117 U/L 76     AST 0 - 37 U/L 10     ALT 0 - 35 U/L 7       No results found for: "CHOL", "HDL", "LDLCALC", "LDLDIRECT", "TRIG", "CHOLHDL" No components found for: "NTPROBNP" No results for input(s): "PROBNP" in the last 8760 hours. Recent Labs    12/28/21 1328 04/27/22 1134  TSH 0.153* 0.04*    BMP Recent Labs    11/29/21 1817 12/28/21 1125 12/28/21 1200 04/27/22 1134  NA 138 140 139 140  K 3.5 5.1 3.9 3.7  CL 105 106 105 106  CO2 26 19* 26 24  GLUCOSE 86 90 88 95  BUN 10 8 8 10   CREATININE 0.83 0.76 0.79 0.70  CALCIUM 9.0 9.6 9.5  9.4  GFRNONAA >60 >60 >60  --     HEMOGLOBIN A1C No results found for: "HGBA1C", "MPG"  External Labs: Collected: July 13, 2022 [facility]. Sodium 140, potassium 3.9, chloride 106, bicarb 24. AST 13, ALT 9, alkaline phosphatase 83. ESR 6.  CRP <3 Hemoglobin 12, hematocrit 38.4% ANA screen positive, ANA titer 1: 80 (high), nuclear fine speckled pattern Rheumatoid factor below normal limits TSH 0.26 (low)  IMPRESSION:    ICD-10-CM   1. Palpitations  R00.2 EKG 12-Lead    LONG TERM MONITOR (3-14 DAYS)    2. Cardiomegaly  I51.7 [facility] ECHOCARDIOGRAM COMPLETE    3. Benign hypertension  I10        RECOMMENDATIONS: LULABELL BACIGALUPI is a 32 y.o. African-American female whose past medical history and cardiac risk factors include: Hypothyroidism, HTN, GERD, esophageal dysphagia, chronic constipation.   Palpitations No identifiable reversible cause. TSH is still low -likely concerning for iatrogenic hyperthyroidism.  I have asked asked her to follow-up with PCP for medication titration if warranted.. EKG shows sinus rhythm without ectopy. 7-day extended Holter monitor to evaluate for dysrhythmias. Currently on metoprolol for blood pressure management per PCP.  Cardiomegaly Had a CT of the abdomen back in September 2023 for GI/GU purposes.  Final impression was noted mild cardiomegaly. Follow-up chest x-rays and CT of the chest PE protocol do not really illustrate the findings of mild cardiomegaly. Echo will be ordered to evaluate for structural heart disease and left ventricular systolic function.  Benign essential hypertension: Currently managed by PCP.  Patient states that she recently had labs with PCP and was recently notified of being positive for lupus.  She plans to follow-up with PCP in the coming weeks.  I recommended that she also follows up with hematology oncology if she is truly lupus anticoagulant positive in the setting of 3 spontaneous  abortions.  She should be worked up for hypercoagulable states.  Data Reviewed: I have independently reviewed external notes provided by the referring provider as part of this office visit.   I have independently reviewed results of EKG, labs, as part of medical decision making. I have ordered the following tests:  Orders Placed This Encounter  Procedures   LONG TERM MONITOR (3-14 DAYS)    Standing Status:   Future    Order Specific Question:   Where should this test be performed?    Answer:   [facility]    Order Specific Question:   Does the patient have an implanted cardiac device?    Answer:   No    Order Specific Question:   Prescribed days of wear    Answer:   7    Order Specific Question:   Type of enrollment    Answer:   Clinic Enrollment   EKG 12-Lead   [facility] ECHOCARDIOGRAM COMPLETE    Standing Status:   Future    Standing Expiration Date:   07/26/2023   I have not made medications changes at today's encounter as noted above.  FINAL MEDICATION LIST END OF ENCOUNTER: No orders of the defined types were placed in this encounter.   Medications Discontinued During This Encounter  Medication Reason   esomeprazole (NEXIUM) 40 MG capsule    labetalol (NORMODYNE) 100 MG tablet      Current Outpatient Medications:    Ferrous Fumarate (IRON) 18 MG TBCR, Take 72 mg by mouth daily., Disp: , Rfl:    levothyroxine (SYNTHROID) 200 MCG tablet, Take 1 tablet (200 mcg total) by mouth daily before breakfast., Disp: 30 tablet, Rfl: 0   metoprolol succinate (TOPROL-XL) 25 MG 24 hr tablet, Take 25 mg by mouth daily., Disp: , Rfl:   Orders Placed This Encounter  Procedures   LONG TERM MONITOR (3-14 DAYS)   EKG 12-Lead   [facility] ECHOCARDIOGRAM COMPLETE    There are no Patient Instructions on file for this visit.   --Continue cardiac medications as reconciled in final medication list. --Return in about 6 weeks (around 09/06/2022) for Follow up, Palpitations, Review test results. or  sooner if needed. --Continue follow-up with your primary care physician regarding the management of your other chronic comorbid conditions.  Patient's questions and concerns were addressed to her satisfaction. She voices understanding of the instructions provided during this encounter.   This note was created using a voice recognition software as a result there may be grammatical errors inadvertently enclosed that do not reflect the nature of this encounter. Every attempt is made to correct such errors.  Tessa Lerner, [place], [facility]  Pager:  [phone] Office: [phone]

## 2022-07-26 NOTE — Addendum Note (Signed)
Addended by: Durward Mallard on: 07/26/2022 09:38 AM   Modules accepted: Orders

## 2022-08-13 ENCOUNTER — Other Ambulatory Visit: Payer: Self-pay | Admitting: Cardiology

## 2022-08-13 DIAGNOSIS — R002 Palpitations: Secondary | ICD-10-CM

## 2022-08-17 DIAGNOSIS — E890 Postprocedural hypothyroidism: Secondary | ICD-10-CM | POA: Diagnosis not present

## 2022-08-17 DIAGNOSIS — Z6835 Body mass index (BMI) 35.0-35.9, adult: Secondary | ICD-10-CM | POA: Diagnosis not present

## 2022-09-06 ENCOUNTER — Other Ambulatory Visit: Payer: 59

## 2022-09-06 ENCOUNTER — Ambulatory Visit: Payer: 59 | Admitting: Cardiology

## 2022-09-07 ENCOUNTER — Other Ambulatory Visit: Payer: 59

## 2022-09-09 NOTE — Telephone Encounter (Signed)
Spoke to pt and she stated she was not ready to reschedule at this time.

## 2022-09-23 ENCOUNTER — Ambulatory Visit: Payer: 59 | Admitting: Cardiology

## 2022-12-13 ENCOUNTER — Telehealth: Payer: Self-pay | Admitting: Diagnostic Neuroimaging

## 2022-12-13 NOTE — Telephone Encounter (Signed)
Received sleep referral from [facility] for sleep study and possible treatment for oral appliance. Placed in sleep referrals box.

## 2022-12-20 NOTE — Telephone Encounter (Signed)
Office notified via fax not enough information and our providers do not fit for an oral appliance.

## 2023-01-03 NOTE — Progress Notes (Deleted)
Office Visit Note  Patient: Tanya Chapman             Date of Birth: 03-28-1990           MRN: [id number]             PCP: Salvatore Decent, PA-C Referring: Arthur Holms, [place] Visit Date: 01/04/2023 Occupation: @GUAROCC @  Subjective:  No chief complaint on file.   History of Present Illness: Tanya Chapman is a 32 y.o. female ***     Activities of Daily Living:  Patient reports morning stiffness for *** {minute/hour:19697}.   Patient {ACTIONS;DENIES/REPORTS:21021675::"Denies"} nocturnal pain.  Difficulty dressing/grooming: {ACTIONS;DENIES/REPORTS:21021675::"Denies"} Difficulty climbing stairs: {ACTIONS;DENIES/REPORTS:21021675::"Denies"} Difficulty getting out of chair: {ACTIONS;DENIES/REPORTS:21021675::"Denies"} Difficulty using hands for taps, buttons, cutlery, and/or writing: {ACTIONS;DENIES/REPORTS:21021675::"Denies"}  No Rheumatology ROS completed.   PMFS History:  Patient Active Problem List   Diagnosis Date Noted   Gastroesophageal reflux disease 04/28/2022   LLQ pain 04/28/2022   Chronic constipation 04/28/2022   Esophageal dysphagia 04/28/2022   Bloating 04/28/2022   Hypothyroidism 08/31/2018   Dysmenorrhea 10/22/2011   Menorrhagia 10/22/2011   Thyroid goiter 11/03/2010   Graves' disease 11/03/2010    Past Medical History:  Diagnosis Date   Anxiety    Depression    Fatigue    Generalized headaches    migraines and generalized    Hypertension    Hyperthyroidism    MRSA carrier    Thyroid disease    hyperthyroid    Trouble swallowing    Visual disturbance    eyes bulging, blurry, and hurting    Weight loss, unintentional     Family History  Problem Relation Age of Onset   Migraines Mother    Migraines Brother    Diabetes Maternal Grandmother    Hypertension Maternal Grandmother    Hypertension Maternal Grandfather    Stroke Maternal Grandfather    Breast cancer Paternal Grandmother    Diabetes Paternal Grandmother    Thyroid disease  Paternal Grandmother    Hypertension Paternal Grandfather    Colon cancer Neg Hx    Esophageal cancer Neg Hx    Inflammatory bowel disease Neg Hx    Liver disease Neg Hx    Pancreatic cancer Neg Hx    Rectal cancer Neg Hx    Stomach cancer Neg Hx    Past Surgical History:  Procedure Laterality Date   CESAREAN SECTION  11/22/08   HERNIA REPAIR  1996   nexplanon  01/23/2015   Nexplanon removed   TOTAL THYROIDECTOMY  11/26/10   UMBILICAL HERNIA REPAIR N/A 07/03/2021   Procedure: LAPAROSCOPIC SUPRAUMBILICAL HERNIA REPAIR;  Surgeon: Luretha Murphy, MD;  Location: [facility];  Service: General;  Laterality: N/A;   Social History   Social History Narrative   Not on file   Immunization History  Administered Date(s) Administered   HPV 9-valent 01/15/2014, 02/25/2015   HPV Quadrivalent 10/21/2011     Objective: Vital Signs: There were no vitals taken for this visit.   Physical Exam   Musculoskeletal Exam: ***  CDAI Exam: CDAI Score: -- Patient Global: --; Provider Global: -- Swollen: --; Tender: -- Joint Exam 01/04/2023   No joint exam has been documented for this visit   There is currently no information documented on the homunculus. Go to the Rheumatology activity and complete the homunculus joint exam.  Investigation: No additional findings.  Imaging: No results found.  Recent Labs: Lab Results  Component Value Date   WBC 5.4 04/27/2022   HGB 13.2 04/27/2022  PLT 353.0 04/27/2022   NA 140 04/27/2022   K 3.7 04/27/2022   CL 106 04/27/2022   CO2 24 04/27/2022   GLUCOSE 95 04/27/2022   BUN 10 04/27/2022   CREATININE 0.70 04/27/2022   BILITOT 0.5 04/27/2022   ALKPHOS 76 04/27/2022   AST 10 04/27/2022   ALT 7 04/27/2022   PROT 7.6 04/27/2022   ALBUMIN 4.1 04/27/2022   CALCIUM 9.4 04/27/2022   GFRAA >60 10/29/2019    Speciality Comments: No specialty comments available.  Procedures:  No procedures performed Allergies: Doxycycline   Assessment / Plan:      Visit Diagnoses: No diagnosis found.  Orders: No orders of the defined types were placed in this encounter.  No orders of the defined types were placed in this encounter.   Face-to-face time spent with patient was *** minutes. Greater than 50% of time was spent in counseling and coordination of care.  Follow-Up Instructions: No follow-ups on file.   Fuller Plan, MD  Note - This record has been created using [employer].  Chart creation errors have been sought, but may not always  have been located. Such creation errors do not reflect on  the standard of medical care.

## 2023-01-04 ENCOUNTER — Encounter: Payer: 59 | Admitting: Internal Medicine

## 2023-03-31 ENCOUNTER — Telehealth: Payer: Self-pay | Admitting: Hematology and Oncology

## 2023-03-31 NOTE — Telephone Encounter (Signed)
 patient called to verify referral recieved for sickle cell.

## 2023-04-14 ENCOUNTER — Telehealth: Payer: Self-pay | Admitting: Physician Assistant

## 2023-04-14 NOTE — Telephone Encounter (Signed)
 Reason for CRM: patient would like to reschedule her Rheumatology before 3/11-that is her surgery date.. pls call her at [phone]

## 2023-06-02 DIAGNOSIS — D500 Iron deficiency anemia secondary to blood loss (chronic): Secondary | ICD-10-CM | POA: Insufficient documentation

## 2023-06-08 ENCOUNTER — Encounter: Payer: Self-pay | Admitting: Internal Medicine

## 2023-06-08 ENCOUNTER — Ambulatory Visit: Payer: Self-pay | Attending: Internal Medicine | Admitting: Internal Medicine

## 2023-06-08 VITALS — BP 115/77 | HR 84 | Resp 14 | Ht 65.0 in | Wt 205.0 lb

## 2023-06-08 DIAGNOSIS — E890 Postprocedural hypothyroidism: Secondary | ICD-10-CM

## 2023-06-08 DIAGNOSIS — R768 Other specified abnormal immunological findings in serum: Secondary | ICD-10-CM | POA: Diagnosis not present

## 2023-06-08 DIAGNOSIS — M5441 Lumbago with sciatica, right side: Secondary | ICD-10-CM

## 2023-06-08 DIAGNOSIS — G8929 Other chronic pain: Secondary | ICD-10-CM

## 2023-06-08 DIAGNOSIS — E0500 Thyrotoxicosis with diffuse goiter without thyrotoxic crisis or storm: Secondary | ICD-10-CM | POA: Diagnosis not present

## 2023-06-08 DIAGNOSIS — M545 Low back pain, unspecified: Secondary | ICD-10-CM | POA: Insufficient documentation

## 2023-06-08 NOTE — Progress Notes (Signed)
 Office Visit Note  Patient: Tanya Chapman             Date of Birth: [birth year]           MRN: [id number]             PCP: Merced Stair, NP Referring: Hal Levans, PA Visit Date: 06/08/2023 Occupation: Dispatcher  Subjective:  New Patient (Initial Visit) (Patient states she has pain in her lower back. Patient states she is here for abnormal labs. )   Discussed the use of AI scribe software for clinical note transcription with the patient, who gave verbal consent to proceed.  History of Present Illness   Tanya Chapman is a 33 year old female who presents with a positive ANA test for evaluation of possible lupus. She was referred by another doctor for evaluation of a positive ANA test with symptoms including joint pain, chronic fatigue, facial rash, and hair loss.  She has been experiencing chronic back pain and fatigue for a couple of years, which she attributes to her thyroid  condition. She describes fluctuations in energy levels, with some days feeling very tired. She also reports poor sleep, averaging about four hours a night, and sometimes prefers to sleep during the day.  She experiences shortness of breath, chest pain, and stiffness. She has gained weight, which she associates with her thyroid  condition. Her thyroid  was removed in 2012, and she has been on a stable dose of Synthroid  for the past year and a half.  She reports a rash that appeared in January between her breasts and legs, which resolved with cream treatment. She has a history of urinary tract infections but has not required antibiotics recently. Mild facial hyperpigmented rashes on the cheeks these vary in size and intensity. Not specifically associated with sun exposure and does not involve her nose.  She has a history of iron deficiency anemia, for which she recently received an iron infusion. She experiences pica, specifically craving ice, which she attributes to her anemia.  No family history of  lupus or autoimmune diseases, but her grandmother had thyroid  disease.    07/2022 ANA 1:80 speckled RF neg ESR wnl CRP wnl  Activities of Daily Living:  Patient reports morning stiffness for 1 hour.   Patient Reports nocturnal pain.  Difficulty dressing/grooming: Denies Difficulty climbing stairs: Denies Difficulty getting out of chair: Denies Difficulty using hands for taps, buttons, cutlery, and/or writing: Denies  Review of Systems  Constitutional:  Positive for fatigue.  HENT:  Positive for mouth dryness. Negative for mouth sores.   Eyes:  Positive for dryness.  Respiratory:  Positive for shortness of breath.   Cardiovascular:  Positive for chest pain and palpitations.  Gastrointestinal:  Negative for blood in stool, constipation and diarrhea.  Endocrine: Negative for increased urination.  Genitourinary:  Negative for involuntary urination.  Musculoskeletal:  Positive for joint pain, gait problem, joint pain, myalgias, muscle weakness, morning stiffness and myalgias. Negative for joint swelling and muscle tenderness.  Skin:  Positive for rash and hair loss. Negative for color change and sensitivity to sunlight.  Allergic/Immunologic: Negative for susceptible to infections.  Neurological:  Negative for dizziness and headaches.  Hematological:  Negative for swollen glands.  Psychiatric/Behavioral:  Positive for depressed mood and sleep disturbance. The patient is nervous/anxious.     PMFS History:  Patient Active Problem List   Diagnosis Date Noted   Positive ANA (antinuclear antibody) 06/08/2023   Low back pain 06/08/2023   Iron deficiency  anemia due to chronic blood loss 06/02/2023   Gastroesophageal reflux disease 04/28/2022   LLQ pain 04/28/2022   Chronic constipation 04/28/2022   Esophageal dysphagia 04/28/2022   Bloating 04/28/2022   Hypothyroidism 08/31/2018   Dysmenorrhea 10/22/2011   Menorrhagia 10/22/2011   Thyroid  goiter 11/03/2010   Graves' disease  11/03/2010    Past Medical History:  Diagnosis Date   Anxiety    Depression    Fatigue    Generalized headaches    migraines and generalized    Hypertension    Hyperthyroidism    MRSA carrier    Thyroid  disease    hyperthyroid    Trouble swallowing    Visual disturbance    eyes bulging, blurry, and hurting    Weight loss, unintentional     Family History  Problem Relation Age of Onset   Migraines Mother    Migraines Brother    Diabetes Maternal Grandmother    Hypertension Maternal Grandmother    Hypertension Maternal Grandfather    Stroke Maternal Grandfather    Breast cancer Paternal Grandmother    Diabetes Paternal Grandmother    Thyroid  disease Paternal Grandmother    Hypertension Paternal Grandfather    Colon cancer Neg Hx    Esophageal cancer Neg Hx    Inflammatory bowel disease Neg Hx    Liver disease Neg Hx    Pancreatic cancer Neg Hx    Rectal cancer Neg Hx    Stomach cancer Neg Hx    Past Surgical History:  Procedure Laterality Date   CESAREAN SECTION  11/22/08   HERNIA REPAIR  1996   nexplanon   01/23/2015   Nexplanon  removed   TOTAL THYROIDECTOMY  11/26/10   UMBILICAL HERNIA REPAIR N/A 07/03/2021   Procedure: LAPAROSCOPIC SUPRAUMBILICAL HERNIA REPAIR;  Surgeon: Jacolyn Matar, MD;  Location: [facility];  Service: General;  Laterality: N/A;   Social History   Social History Narrative   Not on file   Immunization History  Administered Date(s) Administered   HPV 9-valent 01/15/2014, 02/25/2015   HPV Quadrivalent 10/21/2011     Objective: Vital Signs: BP 115/77 (BP Location: Right Arm, Patient Position: Sitting, Cuff Size: Normal)   Pulse 84   Resp 14   Ht 5\' 5"  (1.651 m)   Wt 205 lb (93 kg)   LMP 05/26/2023   Breastfeeding No   BMI 34.11 kg/m    Physical Exam Eyes:     Conjunctiva/sclera: Conjunctivae normal.  Cardiovascular:     Rate and Rhythm: Normal rate and regular rhythm.  Pulmonary:     Effort: Pulmonary effort is normal.      Breath sounds: Normal breath sounds.  Lymphadenopathy:     Cervical: No cervical adenopathy.  Skin:    General: Skin is warm and dry.  Neurological:     Mental Status: She is alert.  Psychiatric:        Mood and Affect: Mood normal.      Musculoskeletal Exam:  Shoulders full ROM no tenderness or swelling Elbows full ROM no tenderness or swelling Wrists full ROM no tenderness or swelling Fingers full ROM no tenderness or swelling Right sided low back tenderness to paraspinal muscles and extending laterally towards hip Hip normal internal and external rotation without pain, no tenderness to lateral hip palpation Knees full ROM no tenderness or swelling Ankles full ROM no tenderness or swelling   Investigation: No additional findings.  Imaging: No results found.  Recent Labs: Lab Results  Component Value Date   WBC  5.4 04/27/2022   HGB 13.2 04/27/2022   PLT 353.0 04/27/2022   NA 140 04/27/2022   K 3.7 04/27/2022   CL 106 04/27/2022   CO2 24 04/27/2022   GLUCOSE 95 04/27/2022   BUN 10 04/27/2022   CREATININE 0.70 04/27/2022   BILITOT 0.5 04/27/2022   ALKPHOS 76 04/27/2022   AST 10 04/27/2022   ALT 7 04/27/2022   PROT 7.6 04/27/2022   ALBUMIN 4.1 04/27/2022   CALCIUM 9.4 04/27/2022   GFRAA >60 10/29/2019    Speciality Comments: No specialty comments available.  Procedures:  No procedures performed Allergies: Doxycycline    Assessment / Plan:     Visit Diagnoses: Positive ANA (antinuclear antibody) - Plan: RNP Antibody, Anti-Smith antibody, Sjogrens syndrome-A extractable nuclear antibody, Anti-DNA antibody, double-stranded Positive ANA test associated with autoimmune diseases, not definitive for lupus. Thyroid  disorder may contribute to positive ANA. No major lupus red flags observed. Further antibody tests needed to rule out lupus. - Order double-stranded DNA, Smith, RNP, and SSA antibody tests. 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 Symptoms align with thyroid  disorder.  Chronic back pain Chronic back pain with right-sided tenderness and sciatica symptoms.  Appears most consistent with muscular type pain based on exam and there are no severe leg deficits.  No new red flags.  Iron deficiency anemia Iron deficiency anemia with fatigue and pica. Recent iron infusion administered.    Orders: Orders Placed This Encounter  Procedures   RNP Antibody   Anti-Smith antibody   Sjogrens syndrome-A extractable nuclear antibody   Anti-DNA antibody, double-stranded   No orders of the defined types were placed in this encounter.    Follow-Up Instructions: No follow-ups on file.   Matt Song, MD  Note - This record has been created using [employer].  Chart creation errors have been sought, but may not always  have been located. Such creation errors do not reflect on  the standard of medical care.

## 2023-06-09 LAB — ANTI-DNA ANTIBODY, DOUBLE-STRANDED: ds DNA Ab: <1 [IU]/mL

## 2023-06-09 LAB — SJOGRENS SYNDROME-A EXTRACTABLE NUCLEAR ANTIBODY: SSA (Ro) (ENA) Antibody, IgG: <1 AI

## 2023-06-09 LAB — RNP ANTIBODY: Ribonucleic Protein(ENA) Antibody, IgG: <1 AI

## 2023-06-09 LAB — ANTI-SMITH ANTIBODY: ENA SM Ab Ser-aCnc: <1 AI
